# Patient Record
Sex: Male | Born: 1960
Health system: Southern US, Community
[De-identification: ages and names within clinical notes are randomized; demographics above are authoritative.]

## PROBLEM LIST (undated history)

## (undated) HISTORY — PX: HERNIA REPAIR: SHX51

---

## 2010-05-10 DIAGNOSIS — I10 Essential (primary) hypertension: Secondary | ICD-10-CM | POA: Insufficient documentation

## 2012-10-06 ENCOUNTER — Ambulatory Visit: Payer: Self-pay | Admitting: Gastroenterology

## 2012-10-06 LAB — HM COLONOSCOPY

## 2012-10-07 LAB — PATHOLOGY REPORT

## 2013-05-15 ENCOUNTER — Emergency Department: Payer: Self-pay | Admitting: Emergency Medicine

## 2013-09-16 LAB — HEPATIC FUNCTION PANEL
ALK PHOS: 49 U/L (ref 25–125)
ALT: 37 U/L (ref 10–40)
AST: 23 U/L (ref 14–40)
BILIRUBIN, TOTAL: 1 mg/dL

## 2013-09-16 LAB — BASIC METABOLIC PANEL
BUN: 11 mg/dL (ref 4–21)
CREATININE: 0.9 mg/dL (ref 0.6–1.3)
Glucose: 98 mg/dL
POTASSIUM: 4.7 mmol/L (ref 3.4–5.3)
SODIUM: 140 mmol/L (ref 137–147)

## 2013-09-16 LAB — LIPID PANEL
Cholesterol: 268 mg/dL — AB (ref 0–200)
HDL: 46 mg/dL (ref 35–70)
LDL CALC: 179 mg/dL
LDL/HDL RATIO: 3.9
TRIGLYCERIDES: 216 mg/dL — AB (ref 40–160)

## 2013-09-16 LAB — CBC AND DIFFERENTIAL
HCT: 48 % (ref 41–53)
HEMOGLOBIN: 16.7 g/dL (ref 13.5–17.5)
Neutrophils Absolute: 4 /uL
PLATELETS: 257 10*3/uL (ref 150–399)
WBC: 7.4 10^3/mL

## 2013-09-16 LAB — PSA: PSA: 0.7

## 2013-09-16 LAB — TSH: TSH: 3.65 u[IU]/mL (ref 0.41–5.90)

## 2014-04-30 IMAGING — CR RIGHT FOOT COMPLETE - 3+ VIEW
1 series · 4 of 4 positions shown · non-contrast
Comparison: none

REASON FOR EXAM: dorsum pain, swelling s/p trauma
COMMENTS:

PROCEDURE:     DXR - DXR FOOT RT COMPLETE W/OBLIQUES  - May 16, 2013 [DATE]
RESULT:     Right foot images demonstrate no definite fracture, dislocation
or radiopaque foreign body.

[Series 1: x foot ap right · 0.14mm/px · 4 of 4 slices shown]
[im 1/4]
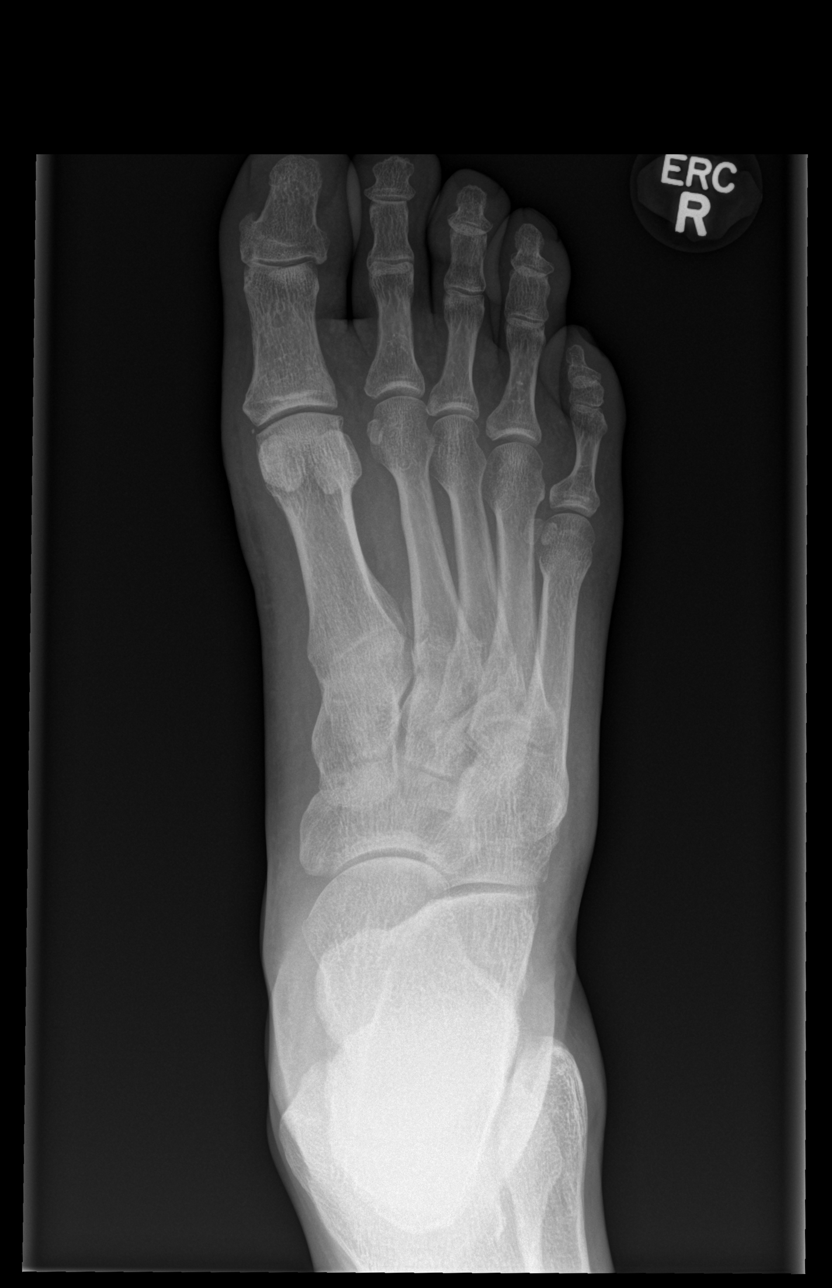
[im 2/4]
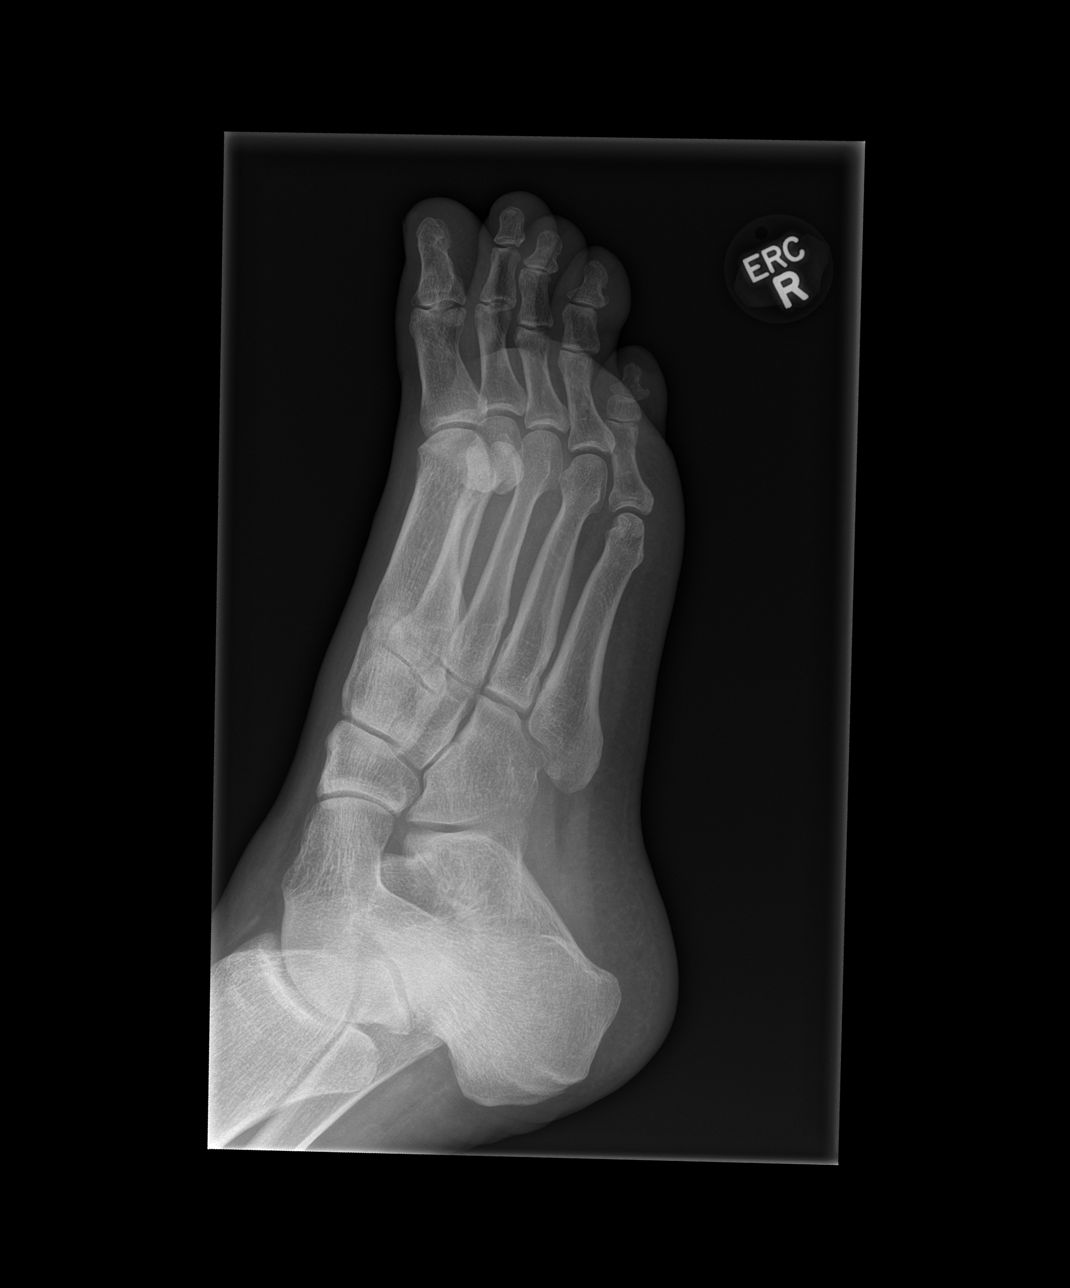
[im 3/4]
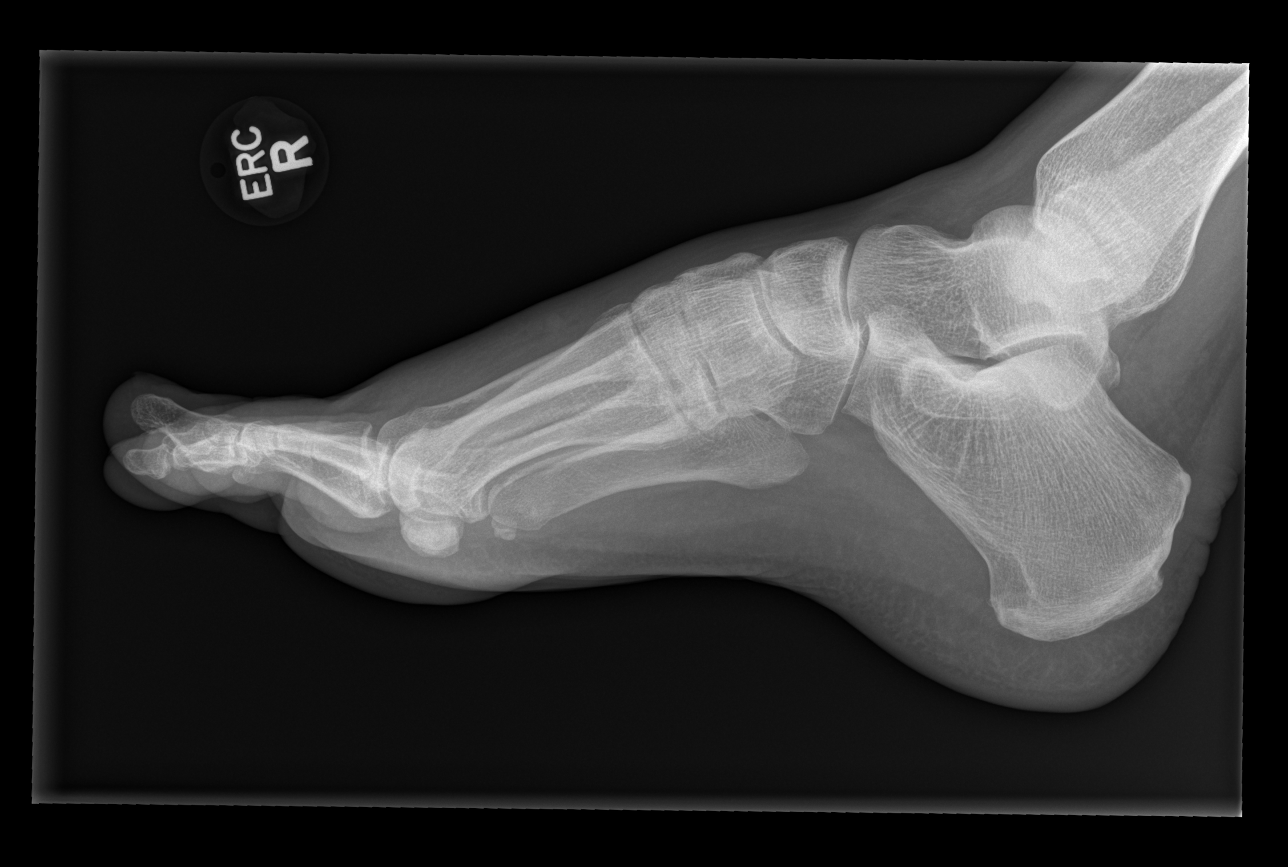
[im 4/4]
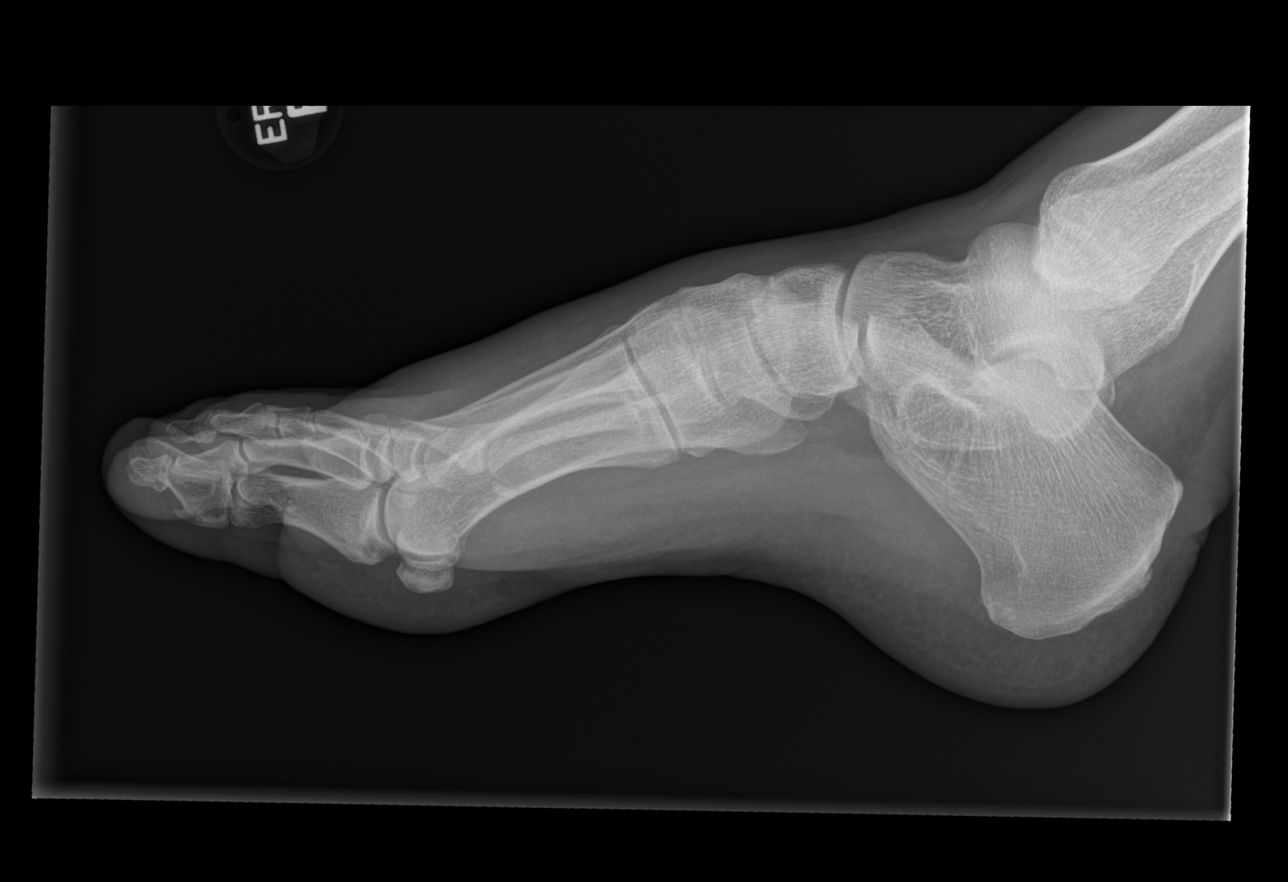

[4 of 4 positions shown; findings below may reference images not displayed]

IMPRESSION: Please see above.

[REDACTED]

## 2016-02-29 DIAGNOSIS — R001 Bradycardia, unspecified: Secondary | ICD-10-CM | POA: Insufficient documentation

## 2016-02-29 DIAGNOSIS — K219 Gastro-esophageal reflux disease without esophagitis: Secondary | ICD-10-CM | POA: Insufficient documentation

## 2016-02-29 DIAGNOSIS — D171 Benign lipomatous neoplasm of skin and subcutaneous tissue of trunk: Secondary | ICD-10-CM | POA: Insufficient documentation

## 2016-02-29 DIAGNOSIS — D239 Other benign neoplasm of skin, unspecified: Secondary | ICD-10-CM | POA: Insufficient documentation

## 2016-03-07 ENCOUNTER — Encounter: Payer: Self-pay | Admitting: Family Medicine

## 2016-04-08 DIAGNOSIS — H9042 Sensorineural hearing loss, unilateral, left ear, with unrestricted hearing on the contralateral side: Secondary | ICD-10-CM | POA: Diagnosis not present

## 2016-04-08 DIAGNOSIS — H6123 Impacted cerumen, bilateral: Secondary | ICD-10-CM | POA: Diagnosis not present

## 2016-04-09 ENCOUNTER — Encounter: Payer: Self-pay | Admitting: Family Medicine

## 2016-04-25 ENCOUNTER — Encounter: Payer: Self-pay | Admitting: Family Medicine

## 2016-04-25 ENCOUNTER — Ambulatory Visit (INDEPENDENT_AMBULATORY_CARE_PROVIDER_SITE_OTHER): Payer: BLUE CROSS/BLUE SHIELD | Admitting: Family Medicine

## 2016-04-25 VITALS — BP 172/94 | HR 58 | Temp 98.1°F | Resp 16 | Ht 73.0 in | Wt 216.0 lb

## 2016-04-25 DIAGNOSIS — G629 Polyneuropathy, unspecified: Secondary | ICD-10-CM | POA: Diagnosis not present

## 2016-04-25 DIAGNOSIS — Z23 Encounter for immunization: Secondary | ICD-10-CM

## 2016-04-25 DIAGNOSIS — R55 Syncope and collapse: Secondary | ICD-10-CM

## 2016-04-25 DIAGNOSIS — Z0000 Encounter for general adult medical examination without abnormal findings: Secondary | ICD-10-CM

## 2016-04-25 DIAGNOSIS — Z125 Encounter for screening for malignant neoplasm of prostate: Secondary | ICD-10-CM

## 2016-04-25 DIAGNOSIS — I10 Essential (primary) hypertension: Secondary | ICD-10-CM | POA: Diagnosis not present

## 2016-04-25 DIAGNOSIS — Z1211 Encounter for screening for malignant neoplasm of colon: Secondary | ICD-10-CM | POA: Diagnosis not present

## 2016-04-25 LAB — POCT URINALYSIS DIPSTICK
Bilirubin, UA: NEGATIVE
Blood, UA: NEGATIVE
GLUCOSE UA: NEGATIVE
KETONES UA: NEGATIVE
Leukocytes, UA: NEGATIVE
Nitrite, UA: NEGATIVE
Protein, UA: NEGATIVE
SPEC GRAV UA: 1.02
Urobilinogen, UA: NEGATIVE
pH, UA: 6

## 2016-04-25 LAB — IFOBT (OCCULT BLOOD): IFOBT: NEGATIVE

## 2016-04-25 MED ORDER — LISINOPRIL-HYDROCHLOROTHIAZIDE 20-12.5 MG PO TABS: 1.0000 | ORAL_TABLET | Freq: Every day | ORAL | Status: DC

## 2016-04-25 MED ORDER — AMLODIPINE BESYLATE 5 MG PO TABS: 5.0000 mg | ORAL_TABLET | Freq: Every day | ORAL | Status: DC

## 2016-04-25 NOTE — Progress Notes (Signed)
Patient ID: [name], male   DOB: 12-24-1960, 55 y.o.   MRN: [id number]       Patient: Ronnie Campos, Male    DOB: February 07, 1961, 55 y.o.   MRN: [id number] Visit Date: 04/25/2016  Today's Provider: [name], MD   Chief Complaint  Patient presents with  . Annual Exam  . Hypertension   Subjective:    Annual physical exam Ronnie Campos is a 55 y.o. male who presents today for health maintenance and complete physical. He feels well. He reports exercising occasionally. He reports he is sleeping fairly well.  Last: Colonoscopy- 10/06/2012. Hyperplastic polyps. Repeat in 10 years.    Patient also reports that his blood pressure has been high. Patient reports that he was on blood pressure medication before in the past, but after running out of medication he did not have it refilled. Patient does admit to having a mild headache. Denies shortness of breath, chest pain, blurred vision, or dizziness.   he says he is probably not taking any medication for about 1 year. Patient has been out of the [place] 2 [place] due to his employer. He has not been to any other primary care provider since he was last seen here in our office.   Review of Systems  Constitutional: Negative.   Eyes: Negative.   Respiratory: Negative.   Cardiovascular: Negative.   Gastrointestinal: Negative.   Endocrine: Negative.   Genitourinary: Negative.   Musculoskeletal: Negative.   Skin: Negative.   Allergic/Immunologic: Negative.   Neurological: Positive for headaches.  Hematological: Negative.   Psychiatric/Behavioral: Negative.     Social History      He  reports that he has never smoked. He does not have any smokeless tobacco history on file. He reports that he drinks alcohol.       Social History   Social History  . Marital Status: Single    Spouse Name: N/A  . Number of Children: N/A  . Years of Education: N/A   Social History Main Topics  . Smoking status: Never Smoker   . Smokeless  tobacco: None  . Alcohol Use: 0.0 oz/week    0 Standard drinks or equivalent per week  . Drug Use: None  . Sexual Activity: Not Asked   Other Topics Concern  . None   Social History Narrative    History reviewed. No pertinent past medical history.   Patient Active Problem List   Diagnosis Date Noted  . Bradycardia 02/29/2016  . Acid reflux 02/29/2016  . Lipoma of back 02/29/2016  . Benign neoplasm of skin 02/29/2016  . BP (high blood pressure) 05/10/2010    Past Surgical History  Procedure Laterality Date  . Hernia repair      Family History        Family Status  Relation Status Death Age  . Mother Alive   . Father Alive   . Sister Alive   . Sister Alive   . Sister Alive   . Sister Alive         His family history includes Irregular heart beat in his mother; Scoliosis in his sister; Stomach cancer in his maternal grandmother.    No Known Allergies  Previous Medications   AMLODIPINE (NORVASC) 5 MG TABLET    Take by mouth. Reported on 04/25/2016   CHOLECALCIFEROL (VITAMIN D) 1000 UNITS TABLET    Take by mouth. Reported on 04/25/2016   LISINOPRIL-HYDROCHLOROTHIAZIDE (PRINZIDE,ZESTORETIC) 20-12.5 MG TABLET    Take by mouth.  Reported on 04/25/2016   MINOXIDIL (ROGAINE) 2 % EXTERNAL SOLUTION    Reported on 04/25/2016   MULTIPLE VITAMINS-MINERALS PO       OMEGA-3 FATTY ACIDS PO       OMEPRAZOLE (PRILOSEC) 20 MG CAPSULE    Take by mouth. Reported on 04/25/2016    Patient Care Team: Jerrol Banana., MD as PCP - General (Family Medicine)     Objective:   Vitals: BP 172/94 mmHg  Pulse 58  Temp(Src) 98.1 F (36.7 C)  Resp 16  Ht 6' 1"  (1.854 m)  Wt 216 lb (97.977 kg)  BMI 28.50 kg/m2   Physical Exam  Constitutional: He is oriented to person, place, and time. He appears well-developed and well-nourished.  HENT:  Head: Normocephalic and atraumatic.  Right Ear: External ear normal.  Left Ear: External ear normal.  Nose: Nose normal.  Mouth/Throat:  Oropharynx is clear and moist.  Eyes: Conjunctivae and EOM are normal. Pupils are equal, round, and reactive to light.  Neck: Neck supple. No thyromegaly present.  Cardiovascular: Normal rate, regular rhythm, normal heart sounds and intact distal pulses.   Pulmonary/Chest: Effort normal and breath sounds normal.  Abdominal: Soft.  Genitourinary: Rectum normal, prostate normal and penis normal.  Musculoskeletal: Normal range of motion.  Lymphadenopathy:    He has no cervical adenopathy.  Neurological: He is alert and oriented to person, place, and time. No cranial nerve deficit. He exhibits normal muscle tone. Coordination normal.  Skin: Skin is warm and dry.  Psychiatric: He has a normal mood and affect. His behavior is normal. Judgment and thought content normal.        Assessment & Plan:     Routine Health Maintenance and Physical Exam  Exercise Activities and Dietary recommendations Goals    None      Immunization History  Administered Date(s) Administered  . Hepatitis A 06/27/2000, 01/09/2001  . Hepatitis B 06/27/2000, 01/09/2001, 10/25/2004  . MMR 10/25/2004  . Td 10/25/2004  . Typhoid Inactivated 10/25/2004  . Typhoid Live 06/27/2000    Health Maintenance  Topic Date Due  . Hepatitis C Screening  16-Jan-1961  . HIV Screening  03/12/1976  . COLONOSCOPY  03/13/2011  . TETANUS/TDAP  10/25/2014  . INFLUENZA VACCINE  07/16/2016      Discussed health benefits of physical activity, and encouraged him to engage in regular exercise appropriate for his age and condition.        Hypertension         Restart all medications , including CCP, ACEI, diuretic.          Return to clinic 1 month. Discussed with patient that we may need to do workup for secondary hypertension if remains elevated. I have done the exam and reviewed the above chart and it is accurate to the best of my knowledge.  --------------------------------------------------------------------

## 2016-04-26 LAB — CBC WITH DIFFERENTIAL/PLATELET
BASOS: 0 %
Basophils Absolute: 0 10*3/uL (ref 0.0–0.2)
EOS (ABSOLUTE): 0.2 10*3/uL (ref 0.0–0.4)
EOS: 3 %
HEMATOCRIT: 47.5 % (ref 37.5–51.0)
Hemoglobin: 16.6 g/dL (ref 12.6–17.7)
IMMATURE GRANULOCYTES: 0 %
Immature Grans (Abs): 0 10*3/uL (ref 0.0–0.1)
Lymphocytes Absolute: 2.6 10*3/uL (ref 0.7–3.1)
Lymphs: 32 %
MCH: 32.5 pg (ref 26.6–33.0)
MCHC: 34.9 g/dL (ref 31.5–35.7)
MCV: 93 fL (ref 79–97)
MONOS ABS: 0.5 10*3/uL (ref 0.1–0.9)
Monocytes: 7 %
NEUTROS ABS: 4.6 10*3/uL (ref 1.4–7.0)
NEUTROS PCT: 58 %
Platelets: 275 10*3/uL (ref 150–379)
RBC: 5.11 x10E6/uL (ref 4.14–5.80)
RDW: 14 % (ref 12.3–15.4)
WBC: 8 10*3/uL (ref 3.4–10.8)

## 2016-04-26 LAB — PSA: PROSTATE SPECIFIC AG, SERUM: 1 ng/mL (ref 0.0–4.0)

## 2016-04-26 LAB — COMPREHENSIVE METABOLIC PANEL
ALT: 19 IU/L (ref 0–44)
AST: 22 IU/L (ref 0–40)
Albumin/Globulin Ratio: 1.5 (ref 1.2–2.2)
Albumin: 4.8 g/dL (ref 3.5–5.5)
Alkaline Phosphatase: 59 IU/L (ref 39–117)
BUN / CREAT RATIO: 9 (ref 9–20)
BUN: 9 mg/dL (ref 6–24)
Bilirubin Total: 1.1 mg/dL (ref 0.0–1.2)
CALCIUM: 10.4 mg/dL — AB (ref 8.7–10.2)
CO2: 24 mmol/L (ref 18–29)
CREATININE: 1.04 mg/dL (ref 0.76–1.27)
Chloride: 98 mmol/L (ref 96–106)
GFR, EST AFRICAN AMERICAN: 93 mL/min/{1.73_m2} (ref >59)
GFR, EST NON AFRICAN AMERICAN: 80 mL/min/{1.73_m2} (ref >59)
GLOBULIN, TOTAL: 3.2 g/dL (ref 1.5–4.5)
Glucose: 92 mg/dL (ref 65–99)
Potassium: 4.7 mmol/L (ref 3.5–5.2)
Sodium: 137 mmol/L (ref 134–144)
TOTAL PROTEIN: 8 g/dL (ref 6.0–8.5)

## 2016-04-26 LAB — LIPID PANEL
CHOL/HDL RATIO: 5.2 ratio — AB (ref 0.0–5.0)
Cholesterol, Total: 248 mg/dL — ABNORMAL HIGH (ref 100–199)
HDL: 48 mg/dL (ref >39)
LDL CALC: 175 mg/dL — AB (ref 0–99)
Triglycerides: 127 mg/dL (ref 0–149)
VLDL CHOLESTEROL CAL: 25 mg/dL (ref 5–40)

## 2016-04-26 LAB — HEMOGLOBIN A1C
Est. average glucose Bld gHb Est-mCnc: 114 mg/dL
HEMOGLOBIN A1C: 5.6 % (ref 4.8–5.6)

## 2016-04-26 LAB — VITAMIN B12: Vitamin B-12: 610 pg/mL (ref 211–946)

## 2016-04-26 LAB — TSH: TSH: 5.24 u[IU]/mL — AB (ref 0.450–4.500)

## 2016-05-23 ENCOUNTER — Ambulatory Visit: Payer: BLUE CROSS/BLUE SHIELD | Admitting: Family Medicine

## 2016-05-23 ENCOUNTER — Ambulatory Visit (INDEPENDENT_AMBULATORY_CARE_PROVIDER_SITE_OTHER): Payer: BLUE CROSS/BLUE SHIELD | Admitting: Family Medicine

## 2016-05-23 ENCOUNTER — Encounter: Payer: Self-pay | Admitting: Family Medicine

## 2016-05-23 VITALS — BP 138/88 | HR 64 | Temp 97.9°F | Resp 16 | Wt 214.0 lb

## 2016-05-23 DIAGNOSIS — I10 Essential (primary) hypertension: Secondary | ICD-10-CM
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
## 2016-06-03 ENCOUNTER — Other Ambulatory Visit: Payer: Self-pay

## 2016-06-03 DIAGNOSIS — I10 Essential (primary) hypertension: Secondary | ICD-10-CM

## 2016-06-03 MED ORDER — AMLODIPINE BESYLATE 5 MG PO TABS: 5.0000 mg | ORAL_TABLET | Freq: Every day | ORAL | Status: DC

## 2016-06-03 MED ORDER — LISINOPRIL-HYDROCHLOROTHIAZIDE 20-12.5 MG PO TABS: 1.0000 | ORAL_TABLET | Freq: Every day | ORAL | Status: DC

## 2017-03-24 ENCOUNTER — Telehealth: Payer: Self-pay | Admitting: Family Medicine

## 2017-03-24 NOTE — Telephone Encounter (Signed)
yes

## 2017-03-24 NOTE — Telephone Encounter (Signed)
Advised  ED 

## 2017-03-24 NOTE — Telephone Encounter (Signed)
He wants to see if you think it is ok to do that. LOV 05/2016-aa

## 2017-03-24 NOTE — Telephone Encounter (Signed)
Pt called saying he needed to get permission to get a non evasive heart scan.  No heart issues right now. He wants to get this done at a clinic in [place].   He's states he travels a lot and is in [place] Right now.  They are offering a special at a clinic there.  His return number is [phone]  [facility]

## 2017-03-25 NOTE — Telephone Encounter (Signed)
This was donee at 12:30 pm before I went to lunch-aa

## 2017-03-25 NOTE — Telephone Encounter (Signed)
Diane ( a radiologist from the [place]) has requested that we fax an order to them for the heart scan at [phone]. Thanks!

## 2017-04-09 ENCOUNTER — Encounter: Payer: Self-pay | Admitting: Family Medicine

## 2017-06-16 ENCOUNTER — Encounter: Payer: Self-pay | Admitting: Family Medicine

## 2017-06-16 ENCOUNTER — Ambulatory Visit (INDEPENDENT_AMBULATORY_CARE_PROVIDER_SITE_OTHER): Payer: BLUE CROSS/BLUE SHIELD | Admitting: Family Medicine

## 2017-06-16 VITALS — BP 128/84 | HR 62 | Temp 97.8°F | Resp 12 | Ht 73.25 in | Wt 214.0 lb

## 2017-06-16 DIAGNOSIS — Z0000 Encounter for general adult medical examination without abnormal findings: Secondary | ICD-10-CM

## 2017-06-16 DIAGNOSIS — E559 Vitamin D deficiency, unspecified: Secondary | ICD-10-CM | POA: Diagnosis not present

## 2017-06-16 DIAGNOSIS — I10 Essential (primary) hypertension: Secondary | ICD-10-CM | POA: Diagnosis not present

## 2017-06-16 MED ORDER — LISINOPRIL-HYDROCHLOROTHIAZIDE 20-12.5 MG PO TABS: 1.0000 | ORAL_TABLET | Freq: Every day | ORAL | 3 refills | Status: DC

## 2017-06-16 MED ORDER — AMLODIPINE BESYLATE 5 MG PO TABS: 5.0000 mg | ORAL_TABLET | Freq: Every day | ORAL | 3 refills | Status: DC

## 2017-06-16 NOTE — Patient Instructions (Signed)

## 2017-06-16 NOTE — Progress Notes (Signed)
Patient: Ronnie Campos, Male    DOB: Jan 01, 1961, 56 y.o.   MRN: [id number] Visit Date: 06/16/2017  Today's Provider: Vernie Murders, PA   Chief Complaint  Patient presents with  . Annual Exam   Subjective:    Annual physical exam Ronnie Campos is a 56 y.o. male who presents today for health maintenance and complete physical. He feels well. He reports exercising running and has fitness center, trying to increase the amount of exercise he is doing. He reports he is sleeping fairly well.  Immunization History  Administered Date(s) Administered  . Hepatitis A 06/27/2000, 01/09/2001  . Hepatitis B 06/27/2000, 01/09/2001, 10/25/2004  . MMR 10/25/2004  . Td 10/25/2004  . Tdap 04/25/2016  . Typhoid Inactivated 10/25/2004  . Typhoid Live 06/27/2000  . Yellow Fever 01/09/2001   Last colonoscopy was 10/06/12-hyperplastic polyps, repeat in 2023. Last lab work was done on 04/25/16-per that note needs calcium, ionized calcium and PTH repeated and this was not done.   Review of Systems  Constitutional: Negative.   HENT: Negative.   Eyes: Negative.   Respiratory: Positive for shortness of breath.   Cardiovascular: Negative.   Gastrointestinal: Negative.   Endocrine: Negative.   Genitourinary: Negative.   Musculoskeletal: Positive for arthralgias.  Skin: Negative.   Allergic/Immunologic: Negative.   Neurological: Positive for numbness and headaches.  Hematological: Negative.   Psychiatric/Behavioral: Positive for sleep disturbance. The patient is nervous/anxious.     Social History      He  reports that he has never smoked. He has never used smokeless tobacco. He reports that he drinks alcohol. He reports that he does not use drugs.       Social History   Social History  . Marital status: Single    Spouse name: N/A  . Number of children: N/A  . Years of education: N/A   Social History Main Topics  . Smoking status: Never Smoker  . Smokeless tobacco: Never Used  .  Alcohol use 0.0 oz/week  . Drug use: No  . Sexual activity: Yes    Birth control/ protection: None   Other Topics Concern  . None   Social History Narrative  . None    History reviewed. No pertinent past medical history.   Patient Active Problem List   Diagnosis Date Noted  . Bradycardia 02/29/2016  . Acid reflux 02/29/2016  . Lipoma of back 02/29/2016  . Benign neoplasm of skin 02/29/2016  . BP (high blood pressure) 05/10/2010    Past Surgical History:  Procedure Laterality Date  . HERNIA REPAIR      Family History        Family Status  Relation Status  . Mother Alive  . Father Alive  . Sister Alive  . Sister Alive  . Sister Alive  . Sister Alive  . MGM (Not Specified)        His family history includes Irregular heart beat in his mother; Scoliosis in his sister; Stomach cancer in his maternal grandmother.     No Known Allergies   Current Outpatient Prescriptions:  .  amLODipine (NORVASC) 5 MG tablet, Take 1 tablet (5 mg total) by mouth daily. Reported on 04/25/2016, Disp: 90 tablet, Rfl: 3 .  cholecalciferol (VITAMIN D) 1000 units tablet, Take by mouth. Reported on 04/25/2016, Disp: , Rfl:  .  lisinopril-hydrochlorothiazide (PRINZIDE,ZESTORETIC) 20-12.5 MG tablet, Take 1 tablet by mouth daily. Reported on 04/25/2016, Disp: 90 tablet, Rfl: 3 .  MULTIPLE VITAMINS-MINERALS PO, , Disp: ,  Rfl:  .  OMEGA-3 FATTY ACIDS PO, , Disp: , Rfl:    Patient Care Team: Jerrol Banana., MD as PCP - General (Family Medicine)      Objective:   Vitals: BP 128/84   Pulse 62   Temp 97.8 F (36.6 C)   Resp 12   Ht 6' 1.25" (1.861 m)   Wt 214 lb (97.1 kg)   BMI 28.04 kg/m    Wt Readings from Last 3 Encounters:  06/16/17 214 lb (97.1 kg)  05/23/16 214 lb (97.1 kg)  04/25/16 216 lb (98 kg)    BP Readings from Last 3 Encounters:  06/16/17 128/84  05/23/16 138/88  04/25/16 (!) 172/94    Vitals:   06/16/17 0923  BP: 128/84  Pulse: 62  Resp: 12  Temp: 97.8  F (36.6 C)  Weight: 214 lb (97.1 kg)  Height: 6' 1.25" (1.861 m)     Physical Exam  Constitutional: He is oriented to person, place, and time. He appears well-developed and well-nourished.  HENT:  Head: Normocephalic and atraumatic.  Right Ear: External ear normal.  Left Ear: External ear normal.  Nose: Nose normal.  Mouth/Throat: Oropharynx is clear and moist.  Eyes: Conjunctivae and EOM are normal. Pupils are equal, round, and reactive to light. Right eye exhibits no discharge.  Neck: Normal range of motion. Neck supple. No tracheal deviation present. No thyromegaly present.  Cardiovascular: Normal rate, regular rhythm, normal heart sounds and intact distal pulses.   No murmur heard. Pulmonary/Chest: Effort normal and breath sounds normal. No respiratory distress. He has no wheezes. He has no rales. He exhibits no tenderness.  Abdominal: Soft. He exhibits no distension and no mass. There is no tenderness. There is no rebound and no guarding.  Genitourinary: Rectum normal, prostate normal and penis normal. Rectal exam shows guaiac negative stool.  Genitourinary Comments: Right inguinal hernia with well healed scar from herniorrhaphy in 1989 by Dr. Jamal Collin.  Musculoskeletal: Normal range of motion. He exhibits no edema or tenderness.  Lymphadenopathy:    He has no cervical adenopathy.  Neurological: He is alert and oriented to person, place, and time. He has normal reflexes. No cranial nerve deficit. He exhibits normal muscle tone. Coordination normal.  Skin: Skin is warm and dry. No rash noted. No erythema.  Psychiatric: He has a normal mood and affect. His behavior is normal. Judgment and thought content normal.     Depression Screen PHQ 2/9 Scores 06/16/2017  PHQ - 2 Score 0  PHQ- 9 Score 4    Assessment & Plan:    1. Annual physical exam Good general health. Right inguinal hernia stable without discomfort. Prefers to monitor. Immunizations up to date. Given anticipatory  guidance. Recheck routine labs. - CBC with Differential/Platelet - Lipid panel - TSH  2. Essential hypertension Stable and well controlled. Tolerating Prinzide and Norvasc without side effects. Recheck labs and follow pending report.s - lisinopril-hydrochlorothiazide (PRINZIDE,ZESTORETIC) 20-12.5 MG tablet; Take 1 tablet by mouth daily. Reported on 04/25/2016  Dispense: 90 tablet; Refill: 3 - amLODipine (NORVASC) 5 MG tablet; Take 1 tablet (5 mg total) by mouth daily. Reported on 04/25/2016  Dispense: 90 tablet; Refill: 3 - CBC with Differential/Platelet - TSH - Renal Function Panel  3. Serum calcium elevated Elevated calcium level last year. Recheck levels, renal function and PTH. - Calcium, ionized - PTH, Intact and Calcium - Renal Function Panel  4. Vitamin D deficiency Still taking Vitamin-D supplement daily. Will check follow up labs. -  CBC with Differential/Platelet - VITAMIN D 25 Hydroxy (Vit-D Deficiency, Fractures)   Vernie Murders, PA  [facility]

## 2017-06-17 ENCOUNTER — Telehealth: Payer: Self-pay

## 2017-06-17 LAB — CBC WITH DIFFERENTIAL/PLATELET
BASOS: 1 %
Basophils Absolute: 0 10*3/uL (ref 0.0–0.2)
EOS (ABSOLUTE): 0.2 10*3/uL (ref 0.0–0.4)
EOS: 3 %
HEMOGLOBIN: 16.3 g/dL (ref 13.0–17.7)
Hematocrit: 47.5 % (ref 37.5–51.0)
IMMATURE GRANS (ABS): 0 10*3/uL (ref 0.0–0.1)
IMMATURE GRANULOCYTES: 0 %
LYMPHS: 42 %
Lymphocytes Absolute: 2.8 10*3/uL (ref 0.7–3.1)
MCH: 31.8 pg (ref 26.6–33.0)
MCHC: 34.3 g/dL (ref 31.5–35.7)
MCV: 93 fL (ref 79–97)
MONOCYTES: 9 %
Monocytes Absolute: 0.6 10*3/uL (ref 0.1–0.9)
NEUTROS ABS: 3.1 10*3/uL (ref 1.4–7.0)
NEUTROS PCT: 45 %
PLATELETS: 240 10*3/uL (ref 150–379)
RBC: 5.12 x10E6/uL (ref 4.14–5.80)
RDW: 14.7 % (ref 12.3–15.4)
WBC: 6.8 10*3/uL (ref 3.4–10.8)

## 2017-06-17 LAB — RENAL FUNCTION PANEL
ALBUMIN: 4.9 g/dL (ref 3.5–5.5)
BUN/Creatinine Ratio: 16 (ref 9–20)
BUN: 16 mg/dL (ref 6–24)
CALCIUM: 10.2 mg/dL (ref 8.7–10.2)
CO2: 23 mmol/L (ref 20–29)
CREATININE: 1.01 mg/dL (ref 0.76–1.27)
Chloride: 98 mmol/L (ref 96–106)
GFR calc Af Amer: 96 mL/min/{1.73_m2} (ref >59)
GFR calc non Af Amer: 83 mL/min/{1.73_m2} (ref >59)
GLUCOSE: 93 mg/dL (ref 65–99)
PHOSPHORUS: 3.5 mg/dL (ref 2.5–4.5)
POTASSIUM: 4.8 mmol/L (ref 3.5–5.2)
SODIUM: 137 mmol/L (ref 134–144)

## 2017-06-17 LAB — LIPID PANEL
Chol/HDL Ratio: 4.3 ratio (ref 0.0–5.0)
Cholesterol, Total: 220 mg/dL — ABNORMAL HIGH (ref 100–199)
HDL: 51 mg/dL (ref >39)
LDL CALC: 142 mg/dL — AB (ref 0–99)
Triglycerides: 134 mg/dL (ref 0–149)
VLDL CHOLESTEROL CAL: 27 mg/dL (ref 5–40)

## 2017-06-17 LAB — VITAMIN D 25 HYDROXY (VIT D DEFICIENCY, FRACTURES): Vit D, 25-Hydroxy: 22.1 ng/mL — ABNORMAL LOW (ref 30.0–100.0)

## 2017-06-17 LAB — TSH: TSH: 4.07 u[IU]/mL (ref 0.450–4.500)

## 2017-06-17 LAB — PTH, INTACT AND CALCIUM: PTH: 38 pg/mL (ref 15–65)

## 2017-06-17 LAB — CALCIUM, IONIZED: Calcium, Ion: 5.3 mg/dL (ref 4.5–5.6)

## 2017-06-17 NOTE — Telephone Encounter (Signed)
Patient advised. He states he will call back next week to schedule a follow up appointment.

## 2017-06-17 NOTE — Telephone Encounter (Signed)
-----   Message from Margo Common, [place] sent at 06/17/2017  8:20 AM EDT ----- Calcium back to normal and parathyroid function normal. All tests normal except LDL cholesterol still high (but better than a year ago). Continue low fat diet, Krill Oil 1000 mg BID and add Red Yeast Rice qd. Schedule follow up appointment in 3-4 months to assess progress.

## 2018-06-10 DIAGNOSIS — H903 Sensorineural hearing loss, bilateral: Secondary | ICD-10-CM | POA: Diagnosis not present

## 2018-09-17 ENCOUNTER — Ambulatory Visit (INDEPENDENT_AMBULATORY_CARE_PROVIDER_SITE_OTHER): Payer: BLUE CROSS/BLUE SHIELD | Admitting: Family Medicine

## 2018-09-17 ENCOUNTER — Ambulatory Visit
Admission: RE | Admit: 2018-09-17 | Discharge: 2018-09-17 | Disposition: A | Discharge status: Home / Self Care | Payer: Self-pay | Source: Ambulatory Visit | Attending: Family Medicine | Admitting: Family Medicine

## 2018-09-17 VITALS — BP 132/90 | HR 60 | Temp 98.9°F | Resp 16 | Wt 214.0 lb

## 2018-09-17 DIAGNOSIS — M79641 Pain in right hand: Secondary | ICD-10-CM

## 2018-09-17 DIAGNOSIS — M79642 Pain in left hand: Secondary | ICD-10-CM

## 2018-09-17 MED ORDER — NAPROXEN 500 MG PO TABS: 500.0000 mg | ORAL_TABLET | Freq: Two times a day (BID) | ORAL | 1 refills | Status: DC | PRN

## 2018-09-17 NOTE — Progress Notes (Signed)
Ronnie Campos  MRN: [id number] DOB: 1961/01/15  Subjective:  HPI   The patient is a 57 year old male who presents for evaluation of left thumb pain and stiffness.  He states that he fell about 3 weeks ago and fell landing on his hand.  He states it swelled and he used ice on it for about 1 week.  The swelling went down and the hand is doing better.  However, his thumb is still hurting and he has limited movement of it.  There is no radiation of pain into the arm.    Patient Active Problem List   Diagnosis Date Noted  . Bradycardia 02/29/2016  . Acid reflux 02/29/2016  . Lipoma of back 02/29/2016  . Benign neoplasm of skin 02/29/2016  . BP (high blood pressure) 05/10/2010    No past medical history on file.  Social History   Socioeconomic History  . Marital status: Single    Spouse name: Not on file  . Number of children: Not on file  . Years of education: Not on file  . Highest education level: Not on file  Occupational History  . Not on file  Social Needs  . Financial resource strain: Not on file  . Food insecurity:    Worry: Not on file    Inability: Not on file  . Transportation needs:    Medical: Not on file    Non-medical: Not on file  Tobacco Use  . Smoking status: Never Smoker  . Smokeless tobacco: Never Used  Substance and Sexual Activity  . Alcohol use: Yes    Alcohol/week: 0.0 standard drinks  . Drug use: No  . Sexual activity: Yes    Birth control/protection: None  Lifestyle  . Physical activity:    Days per week: Not on file    Minutes per session: Not on file  . Stress: Not on file  Relationships  . Social connections:    Talks on phone: Not on file    Gets together: Not on file    Attends religious service: Not on file    Active member of club or organization: Not on file    Attends meetings of clubs or organizations: Not on file    Relationship status: Not on file  . Intimate partner violence:    Fear of current or ex partner: Not on file     Emotionally abused: Not on file    Physically abused: Not on file    Forced sexual activity: Not on file  Other Topics Concern  . Not on file  Social History Narrative  . Not on file    Outpatient Encounter Medications as of 09/17/2018  Medication Sig Note  . amLODipine (NORVASC) 5 MG tablet Take 1 tablet (5 mg total) by mouth daily. Reported on 04/25/2016   . cholecalciferol (VITAMIN D) 1000 units tablet Take by mouth. Reported on 04/25/2016 02/29/2016: Received from: [facility]  . lisinopril-hydrochlorothiazide (PRINZIDE,ZESTORETIC) 20-12.5 MG tablet Take 1 tablet by mouth daily. Reported on 04/25/2016   . MULTIPLE VITAMINS-MINERALS PO  02/29/2016: Received from: [facility]  . OMEGA-3 FATTY ACIDS PO  02/29/2016: Received from: [facility]   No facility-administered encounter medications on file as of 09/17/2018.     No Known Allergies  Review of Systems  Constitutional: Negative.   Musculoskeletal: Positive for falls and joint pain (thumb).  Neurological: Negative.   Psychiatric/Behavioral: Negative.     Objective:  BP 132/90 (BP Location: Left Arm,  Patient Position: Sitting, Cuff Size: Normal)   Pulse 60   Temp 98.9 F (37.2 C) (Oral)   Resp 16   Wt 214 lb (97.1 kg)   BMI 28.04 kg/m   Physical Exam  Constitutional: He is oriented to person, place, and time and well-developed, well-nourished, and in no distress.  Cardiovascular: Normal rate and regular rhythm.  Pulmonary/Chest: Effort normal.  Musculoskeletal: He exhibits tenderness.  He cannot extend his left thumb at all. Vascular and neurologic exam normal.  Neurological: He is alert and oriented to person, place, and time. Gait normal. GCS score is 15.  Skin: Skin is warm and dry.  Psychiatric: Mood, memory, affect and judgment normal.    Assessment and Plan :  1. Right hand pain  - Ambulatory referral to Hand Surgery - DG Hand Complete Left; Future -  naproxen (NAPROSYN) 500 MG tablet; Take 1 tablet (500 mg total) by mouth 2 (two) times daily as needed.  Dispense: 60 tablet; Refill: 1  2. Hand pain, left Pt has injured left thumb and I think has ruptured extensor tendon. Refer to Copy.  I have done the exam and reviewed the chart and it is accurate to the best of my knowledge. Development worker, community has been used and  any errors in dictation or transcription are unintentional. Miguel Aschoff M.D. [facility]

## 2018-09-21 ENCOUNTER — Telehealth: Payer: Self-pay

## 2018-09-21 NOTE — Telephone Encounter (Signed)
-----   Message from Jerrol Banana., MD sent at 09/18/2018  9:22 AM EDT ----- No fracture--keep ortho appt,

## 2018-09-21 NOTE — Telephone Encounter (Signed)
Left message to call back  

## 2018-09-28 NOTE — Telephone Encounter (Signed)
[facility]MT[facility] ED 

## 2018-11-24 DIAGNOSIS — M79645 Pain in left finger(s): Secondary | ICD-10-CM | POA: Insufficient documentation

## 2018-11-25 DIAGNOSIS — M79645 Pain in left finger(s): Secondary | ICD-10-CM | POA: Diagnosis not present

## 2018-11-25 DIAGNOSIS — S66212A Strain of extensor muscle, fascia and tendon of left thumb at wrist and hand level, initial encounter: Secondary | ICD-10-CM | POA: Diagnosis not present

## 2019-01-18 ENCOUNTER — Encounter: Payer: BLUE CROSS/BLUE SHIELD | Admitting: Family Medicine

## 2019-08-12 ENCOUNTER — Other Ambulatory Visit: Payer: Self-pay

## 2019-08-12 DIAGNOSIS — I10 Essential (primary) hypertension: Secondary | ICD-10-CM

## 2019-08-12 MED ORDER — AMLODIPINE BESYLATE 5 MG PO TABS: 5.0000 mg | ORAL_TABLET | Freq: Every day | ORAL | 0 refills | Status: DC

## 2019-08-12 MED ORDER — LISINOPRIL-HYDROCHLOROTHIAZIDE 20-12.5 MG PO TABS: 1.0000 | ORAL_TABLET | Freq: Every day | ORAL | 0 refills | Status: DC

## 2019-08-12 NOTE — Telephone Encounter (Signed)
Patient request refill

## 2019-11-10 ENCOUNTER — Encounter: Payer: Self-pay | Admitting: Family Medicine

## 2019-12-01 NOTE — Progress Notes (Signed)
Patient: Ronnie Campos, Male    DOB: 1961-08-02, 58 y.o.   MRN: [id number] Visit Date: 12/02/2019  Today's Provider: [name], MD   Chief Complaint  Patient presents with  . Annual Exam   Subjective:     Annual physical exam Ronnie Campos is a 58 y.o. male who presents today for health maintenance and complete physical. He feels well. He reports exercising yes. He reports he is sleeping fairly well. Pt single,no children,travels a lot with work. He feels well. -----------------------------------------------------------  Colonoscopy: 10/06/2012  Review of Systems  Constitutional: Negative.   HENT: Negative.   Eyes: Negative.   Cardiovascular: Negative.   Gastrointestinal: Negative.   Endocrine: Negative.   Musculoskeletal: Positive for neck stiffness.  Allergic/Immunologic: Negative.   Hematological: Negative.   Psychiatric/Behavioral: Negative.     Social History      He  reports that he has never smoked. He has never used smokeless tobacco. He reports current alcohol use. He reports that he does not use drugs.       Social History   Socioeconomic History  . Marital status: Single    Spouse name: Not on file  . Number of children: Not on file  . Years of education: Not on file  . Highest education level: Not on file  Occupational History  . Not on file  Tobacco Use  . Smoking status: Never Smoker  . Smokeless tobacco: Never Used  Substance and Sexual Activity  . Alcohol use: Yes    Alcohol/week: 0.0 standard drinks  . Drug use: No  . Sexual activity: Yes    Birth control/protection: None  Other Topics Concern  . Not on file  Social History Narrative  . Not on file   Social Determinants of Health   Financial Resource Strain:   . Difficulty of Paying Living Expenses: Not on file  Food Insecurity:   . Worried About Charity fundraiser in the Last Year: Not on file  . Ran Out of Food in the Last Year: Not on file  Transportation  Needs:   . Lack of Transportation (Medical): Not on file  . Lack of Transportation (Non-Medical): Not on file  Physical Activity:   . Days of Exercise per Week: Not on file  . Minutes of Exercise per Session: Not on file  Stress:   . Feeling of Stress : Not on file  Social Connections:   . Frequency of Communication with Friends and Family: Not on file  . Frequency of Social Gatherings with Friends and Family: Not on file  . Attends Religious Services: Not on file  . Active Member of Clubs or Organizations: Not on file  . Attends Archivist Meetings: Not on file  . Marital Status: Not on file    History reviewed. No pertinent past medical history.   Patient Active Problem List   Diagnosis Date Noted  . Bradycardia 02/29/2016  . Acid reflux 02/29/2016  . Lipoma of back 02/29/2016  . Benign neoplasm of skin 02/29/2016  . BP (high blood pressure) 05/10/2010    Past Surgical History:  Procedure Laterality Date  . HERNIA REPAIR      Family History        Family Status  Relation Name Status  . Mother  Alive  . Father  Alive  . Sister  Alive  . Sister  Alive  . Sister  Alive  . Sister  Alive  . MGM  (  Not Specified)        His family history includes Irregular heart beat in his mother; Scoliosis in his sister; Stomach cancer in his maternal grandmother.      No Known Allergies   Current Outpatient Medications:  .  amLODipine (NORVASC) 5 MG tablet, Take 1 tablet (5 mg total) by mouth daily. Reported on 04/25/2016, Disp: 90 tablet, Rfl: 0 .  cholecalciferol (VITAMIN D) 1000 units tablet, Take by mouth. Reported on 04/25/2016, Disp: , Rfl:  .  lisinopril-hydrochlorothiazide (ZESTORETIC) 20-12.5 MG tablet, Take 1 tablet by mouth daily. Reported on 04/25/2016, Disp: 90 tablet, Rfl: 0 .  MULTIPLE VITAMINS-MINERALS PO, , Disp: , Rfl:  .  naproxen (NAPROSYN) 500 MG tablet, Take 1 tablet (500 mg total) by mouth 2 (two) times daily as needed. (Patient not taking:  Reported on 12/02/2019), Disp: 60 tablet, Rfl: 1 .  OMEGA-3 FATTY ACIDS PO, , Disp: , Rfl:    Patient Care Team: Jerrol Banana., MD as PCP - General (Family Medicine)    Objective:    Vitals: BP 130/80 (BP Location: Right Arm, Patient Position: Sitting, Cuff Size: Large)   Pulse 80   Temp (!) 97.1 F (36.2 C) (Other (Comment))   Resp 16   Ht '6\' 1"'$  (1.854 m)   Wt 218 lb (98.9 kg)   SpO2 98%   BMI 28.76 kg/m    Vitals:   12/02/19 1529  BP: 130/80  Pulse: 80  Resp: 16  Temp: (!) 97.1 F (36.2 C)  TempSrc: Other (Comment)  SpO2: 98%  Weight: 218 lb (98.9 kg)  Height: '6\' 1"'$  (1.854 m)     Physical Exam Vitals reviewed.  Constitutional:      Appearance: Normal appearance.  HENT:     Head: Normocephalic and atraumatic.     Right Ear: Tympanic membrane and external ear normal.     Left Ear: Tympanic membrane and external ear normal.     Nose: Nose normal.  Eyes:     General: No scleral icterus.    Conjunctiva/sclera: Conjunctivae normal.  Neck:     Vascular: No carotid bruit.  Cardiovascular:     Rate and Rhythm: Normal rate and regular rhythm.     Pulses: Normal pulses.     Heart sounds: Normal heart sounds.  Pulmonary:     Effort: Pulmonary effort is normal.     Breath sounds: Normal breath sounds.  Abdominal:     Palpations: Abdomen is soft.  Musculoskeletal:     Right lower leg: No edema.     Left lower leg: No edema.  Lymphadenopathy:     Cervical: No cervical adenopathy.  Skin:    General: Skin is warm and dry.  Neurological:     General: No focal deficit present.     Mental Status: He is alert and oriented to person, place, and time.  Psychiatric:        Mood and Affect: Mood normal.        Behavior: Behavior normal.        Thought Content: Thought content normal.      Depression Screen PHQ 2/9 Scores 12/02/2019 09/17/2018 06/16/2017  PHQ - 2 Score 0 0 0  PHQ- 9 Score 2 - 4       Assessment & Plan:     Routine Health Maintenance  and Physical Exam  Exercise Activities and Dietary recommendations Goals   None     Immunization History  Administered Date(s) Administered  . Hepatitis  A 06/27/2000, 01/09/2001  . Hepatitis B 06/27/2000, 01/09/2001, 10/25/2004  . MMR 10/25/2004  . Td 10/25/2004  . Tdap 04/25/2016  . Typhoid Inactivated 10/25/2004  . Typhoid Live 06/27/2000  . Yellow Fever 01/09/2001    Health Maintenance  Topic Date Due  . HIV Screening  03/12/1976  . INFLUENZA VACCINE  07/17/2019  . COLONOSCOPY  10/06/2022  . TETANUS/TDAP  04/25/2026  . Hepatitis C Screening  Completed     Discussed health benefits of physical activity, and encouraged him to engage in regular exercise appropriate for his age and condition.    --------------------------------------------------------------------  1. Annual physical exam  - PSA - TSH - CBC w/Diff/Platelet - Comprehensive Metabolic Panel (CMET) - Lipid panel - POCT urinalysis dipstick  2. Hypertension, unspecified type Controlled.  Work on habits.  Return to clinic 1 year. - PSA - TSH - CBC w/Diff/Platelet - Comprehensive Metabolic Panel (CMET) - Lipid panel - POCT urinalysis dipstick  Follow up in one year for CPE.    I,Naftali Carchi,acting as a scribe for [name], MD.,have documented all relevant documentation on the behalf of [name], MD,as directed by  [name], MD while in the presence of [name], MD.    [name], MD  [facility]

## 2019-12-02 ENCOUNTER — Encounter: Payer: Self-pay | Admitting: Family Medicine

## 2019-12-02 ENCOUNTER — Other Ambulatory Visit: Payer: Self-pay

## 2019-12-02 ENCOUNTER — Ambulatory Visit (INDEPENDENT_AMBULATORY_CARE_PROVIDER_SITE_OTHER): Payer: Managed Care, Other (non HMO) | Admitting: Family Medicine

## 2019-12-02 VITALS — BP 130/80 | HR 80 | Temp 97.1°F | Resp 16 | Ht 73.0 in | Wt 218.0 lb

## 2019-12-02 DIAGNOSIS — I10 Essential (primary) hypertension: Secondary | ICD-10-CM | POA: Diagnosis not present

## 2019-12-02 DIAGNOSIS — Z0000 Encounter for general adult medical examination without abnormal findings: Secondary | ICD-10-CM | POA: Diagnosis not present

## 2019-12-02 LAB — POCT URINALYSIS DIPSTICK
Appearance: NORMAL
Bilirubin, UA: NEGATIVE
Glucose, UA: NEGATIVE
Ketones, UA: NEGATIVE
Leukocytes, UA: NEGATIVE
Nitrite, UA: NEGATIVE
Odor: NORMAL
Protein, UA: NEGATIVE
Spec Grav, UA: 1.02 (ref 1.010–1.025)
Urobilinogen, UA: 0.2 E.U./dL
pH, UA: 6 (ref 5.0–8.0)

## 2019-12-07 ENCOUNTER — Other Ambulatory Visit: Payer: Self-pay | Admitting: Family Medicine

## 2019-12-07 DIAGNOSIS — I10 Essential (primary) hypertension: Secondary | ICD-10-CM

## 2020-03-30 ENCOUNTER — Other Ambulatory Visit: Payer: Self-pay | Admitting: Family Medicine

## 2020-03-30 DIAGNOSIS — I10 Essential (primary) hypertension: Secondary | ICD-10-CM

## 2020-08-28 ENCOUNTER — Other Ambulatory Visit: Payer: Self-pay | Admitting: Family Medicine

## 2020-08-28 DIAGNOSIS — I10 Essential (primary) hypertension: Secondary | ICD-10-CM

## 2020-08-28 NOTE — Telephone Encounter (Signed)
[facility] faxed refill request for the following medications:  amLODipine (NORVASC) 5 MG tablet  lisinopril-hydrochlorothiazide (ZESTORETIC) 20-12.5 MG tablet  90 day supply Last Rx: 03/30/2020 LOV: 12/02/2019 NOV: 12/05/2020 Please advise. Thanks TNP

## 2020-08-29 MED ORDER — AMLODIPINE BESYLATE 5 MG PO TABS: ORAL_TABLET | ORAL | 0 refills | Status: DC

## 2020-08-29 MED ORDER — LISINOPRIL-HYDROCHLOROTHIAZIDE 20-12.5 MG PO TABS: 1.0000 | ORAL_TABLET | Freq: Every day | ORAL | 0 refills | Status: DC

## 2020-08-29 NOTE — Telephone Encounter (Signed)
Refill sent to patients pharmacy. 

## 2020-11-20 ENCOUNTER — Other Ambulatory Visit: Payer: Self-pay | Admitting: Family Medicine

## 2020-11-20 DIAGNOSIS — I10 Essential (primary) hypertension: Secondary | ICD-10-CM

## 2020-12-05 ENCOUNTER — Encounter: Payer: Self-pay | Admitting: Family Medicine

## 2020-12-17 ENCOUNTER — Other Ambulatory Visit: Payer: Self-pay | Admitting: Family Medicine

## 2020-12-17 DIAGNOSIS — I10 Essential (primary) hypertension: Secondary | ICD-10-CM

## 2021-01-15 ENCOUNTER — Other Ambulatory Visit: Payer: Self-pay | Admitting: Family Medicine

## 2021-01-15 DIAGNOSIS — I10 Essential (primary) hypertension: Secondary | ICD-10-CM

## 2021-02-08 ENCOUNTER — Other Ambulatory Visit: Payer: Self-pay | Admitting: Family Medicine

## 2021-02-08 DIAGNOSIS — I10 Essential (primary) hypertension: Secondary | ICD-10-CM

## 2021-03-23 NOTE — Patient Instructions (Signed)

## 2021-03-23 NOTE — Progress Notes (Signed)
I,[name],acting as a scribe for [name], MD.,have documented all relevant documentation on the behalf of [name], MD,as directed by  [name], MD while in the presence of [name], MD.    Complete physical exam   Patient: Ronnie Campos   DOB: Mar 13, 1961   60 y.o. Male  MRN: [id number] Visit Date: 03/26/2021  Today's healthcare provider: [name], MD   Chief Complaint  Patient presents with  . Annual Exam   Subjective    Ronnie Campos is a 60 y.o. male who presents today for a complete physical exam.  He reports consuming a general diet. The patient does not participate in regular exercise at present. He generally feels well. He reports sleeping well. He does not have additional problems to discuss today.   HPI  He has a couple of other issues today.  He has a mole on his left shoulder his wife wishes to have checked.  He has some mild tenderness in the area of the left lower thoracic back but this is where his backpack hits each day that he carries to work. His blood pressure is also markedly elevated today although he feels well. 04/25/2016 PSA 1.0 04/25/2016 IFOBT Negative   History reviewed. No pertinent past medical history. Past Surgical History:  Procedure Laterality Date  . HERNIA REPAIR     Social History   Socioeconomic History  . Marital status: Single    Spouse name: Not on file  . Number of children: Not on file  . Years of education: Not on file  . Highest education level: Not on file  Occupational History  . Not on file  Tobacco Use  . Smoking status: Never Smoker  . Smokeless tobacco: Never Used  Vaping Use  . Vaping Use: Never used  Substance and Sexual Activity  . Alcohol use: Yes    Alcohol/week: 0.0 standard drinks  . Drug use: No  . Sexual activity: Yes    Birth control/protection: None  Other Topics Concern  . Not on file  Social History Narrative  . Not on file   Social  Determinants of Health   Financial Resource Strain: Not on file  Food Insecurity: Not on file  Transportation Needs: Not on file  Physical Activity: Not on file  Stress: Not on file  Social Connections: Not on file  Intimate Partner Violence: Not on file   Family Status  Relation Name Status  . Mother  Alive  . Father  Alive  . Sister  Alive  . Sister  Alive  . Sister  Alive  . Sister  Alive  . MGM  (Not Specified)   Family History  Problem Relation Age of Onset  . Irregular heart beat Mother   . Scoliosis Sister   . Stomach cancer Maternal Grandmother    No Known Allergies  Patient Care Team: Jerrol Banana., MD as PCP - General (Family Medicine)   Medications: Outpatient Medications Prior to Visit  Medication Sig  . cholecalciferol (VITAMIN D) 1000 units tablet Take by mouth. Reported on 04/25/2016  . MULTIPLE VITAMINS-MINERALS PO   . OMEGA-3 FATTY ACIDS PO   . [DISCONTINUED] amLODipine (NORVASC) 5 MG tablet TAKE 1 TABLET BY MOUTH EVERY DAY  . [DISCONTINUED] lisinopril-hydrochlorothiazide (ZESTORETIC) 20-12.5 MG tablet TAKE 1 TABLET BY MOUTH EVERY DAY  . naproxen (NAPROSYN) 500 MG tablet Take 1 tablet (500 mg total) by mouth 2 (two) times daily as needed. (  Patient not taking: No sig reported)   No facility-administered medications prior to visit.    Review of Systems  Musculoskeletal: Positive for back pain.  All other systems reviewed and are negative.      Objective    BP (!) 185/121 (BP Location: Left Arm, Cuff Size: Large)   Pulse (!) 55   Temp 98.3 F (36.8 C) (Oral)   Resp 16   Ht _0  (1.854 m)   Wt 220 lb (99.8 kg)   SpO2 97%   BMI 29.03 kg/m  BP Readings from Last 3 Encounters:  03/26/21 (!) 185/121  12/02/19 130/80  09/17/18 132/90   Wt Readings from Last 3 Encounters:  03/26/21 220 lb (99.8 kg)  12/02/19 218 lb (98.9 kg)  09/17/18 214 lb (97.1 kg)      Physical Exam Vitals reviewed.  Constitutional:      Appearance:  Normal appearance.  HENT:     Head: Normocephalic and atraumatic.     Right Ear: Tympanic membrane and external ear normal.     Left Ear: Tympanic membrane and external ear normal.     Nose: Nose normal.  Eyes:     General: No scleral icterus.    Conjunctiva/sclera: Conjunctivae normal.  Neck:     Vascular: No carotid bruit.  Cardiovascular:     Rate and Rhythm: Normal rate and regular rhythm.     Pulses: Normal pulses.     Heart sounds: Normal heart sounds.  Pulmonary:     Effort: Pulmonary effort is normal.     Breath sounds: Normal breath sounds.  Abdominal:     Palpations: Abdomen is soft.  Genitourinary:    Penis: Normal.      Testes: Normal.     Prostate: Normal.     Rectum: Normal.  Musculoskeletal:     Right lower leg: No edema.     Left lower leg: No edema.     Comments: He has minimal tenderness over the lower thoracic paraspinal muscles which reproduces the discomfort he has.  No other abnormality on exam.  Lymphadenopathy:     Cervical: No cervical adenopathy.  Skin:    General: Skin is warm and dry.     Comments: The lesion his wife is concerned about is a seborrheic keratosis on the left shoulder/scapula.  Neurological:     General: No focal deficit present.     Mental Status: He is alert and oriented to person, place, and time.  Psychiatric:        Mood and Affect: Mood normal.        Behavior: Behavior normal.        Thought Content: Thought content normal.        Judgment: Judgment normal.       Last depression screening scores PHQ 2/9 Scores 03/26/2021 12/02/2019 09/17/2018  PHQ - 2 Score 0 0 0  PHQ- 9 Score 1 2 -   Last fall risk screening Fall Risk  09/17/2018  Falls in the past year? Yes  Number falls in past yr: 1  Injury with Fall? Yes   Last Audit-C alcohol use screening Alcohol Use Disorder Test (AUDIT) 03/26/2021  1. How often do you have a drink containing alcohol? 3  2. How many drinks containing alcohol do you have on a typical day  when you are drinking? 0  3. How often do you have six or more drinks on one occasion? 1  AUDIT-C Score 4  4. How often during the  last year have you found that you were not able to stop drinking once you had started? 0  5. How often during the last year have you failed to do what was normally expected from you because of drinking? 0  6. How often during the last year have you needed a first drink in the morning to get yourself going after a heavy drinking session? 0  7. How often during the last year have you had a feeling of guilt of remorse after drinking? 0  8. How often during the last year have you been unable to remember what happened the night before because you had been drinking? 0  9. Have you or someone else been injured as a result of your drinking? 0  10. Has a relative or friend or a doctor or another health worker been concerned about your drinking or suggested you cut down? 0  Alcohol Use Disorder Identification Test Final Score (AUDIT) 4   A score of 3 or more in women, and 4 or more in men indicates increased risk for alcohol abuse, EXCEPT if all of the points are from question 1   Results for orders placed or performed in visit on 03/26/21  POCT urinalysis dipstick  Result Value Ref Range   Color, UA Yellow    Clarity, UA Clear    Glucose, UA Negative Negative   Bilirubin, UA Negative    Ketones, UA Negative    Spec Grav, UA 1.020 1.010 - 1.025   Blood, UA Negative    pH, UA 6.5 5.0 - 8.0   Protein, UA Negative Negative   Urobilinogen, UA 0.2 0.2 or 1.0 E.U./dL   Nitrite, UA Negative    Leukocytes, UA Negative Negative  IFOBT POC (occult bld, rslt in office)  Result Value Ref Range   IFOBT Negative     Assessment & Plan    Routine Health Maintenance and Physical Exam  Exercise Activities and Dietary recommendations Goals   None     Immunization History  Administered Date(s) Administered  . Hepatitis A 06/27/2000, 01/09/2001  . Hepatitis B 06/27/2000,  01/09/2001, 10/25/2004  . Janssen (J&J) SARS-COV-2 Vaccination 04/25/2020  . MMR 10/25/2004  . Td 10/25/2004  . Tdap 04/25/2016  . Typhoid Inactivated 10/25/2004  . Typhoid Live 06/27/2000  . Yellow Fever 01/09/2001    Health Maintenance  Topic Date Due  . HIV Screening  Never done  . COVID-19 Vaccine (2 - Booster for [employer] series) 06/20/2020  . INFLUENZA VACCINE  07/16/2021  . COLONOSCOPY (Pts 45-50yr Insurance coverage will need to be confirmed)  10/06/2022  . TETANUS/TDAP  04/25/2026  . Hepatitis C Screening  Completed  . HPV VACCINES  Aged Out    Discussed health benefits of physical activity, and encouraged him to engage in regular exercise appropriate for his age and condition.  1. Annual physical exam  - CBC with Differential/Platelet - Comprehensive metabolic panel - Lipid panel - TSH - POCT urinalysis dipstick  2. Encounter for screening fecal occult blood testing  - IFOBT POC (occult bld, rslt in office)--Negative  3. Screening for prostate cancer  - PSA  4. Screening for blood or protein in urine  - POCT urinalysis dipstick--Negative  5. Essential hypertension Double both with medications and follow-up in 2 months. Increased amlodipine from 5 mg qd to 10 mg qd. Also increased lisinopril-hydrochlorothiazide 20-12.5 mg to bid. - amLODipine (NORVASC) 10 MG tablet; Take 1 tablet (10 mg total) by mouth daily.  Dispense: 30 tablet;  Refill: 2 - CT CARDIAC SCORING (SELF PAY ONLY) - lisinopril-hydrochlorothiazide (ZESTORETIC) 20-12.5 MG tablet; Take 1 tablet by mouth in the morning and at bedtime.  Dispense: 60 tablet; Refill: 1  6. Bradycardia Normal range with heart rate 55. Patient's brother-in-law had recent stress test and patient wishes to do this for risk assessment. Without any symptoms told him his best test was probably cardiac calcium CT scoring - CT CARDIAC SCORING (SELF PAY ONLY) 7.  Thoracic back pain Truly think this is musculoskeletal due  to backpack placement.  No work-up at this time.   Return in about 4 weeks (around 04/23/2021).     I, [name], MD, have reviewed all documentation for this visit. The documentation on 03/26/21 for the exam, diagnosis, procedures, and orders are all accurate and complete.    Harriett Azar Cranford Mon, MD  [facility], [place] [phone] (phone) [phone] (fax)  [place]

## 2021-03-26 ENCOUNTER — Ambulatory Visit (INDEPENDENT_AMBULATORY_CARE_PROVIDER_SITE_OTHER): Payer: Managed Care, Other (non HMO) | Admitting: Family Medicine

## 2021-03-26 ENCOUNTER — Encounter: Payer: Self-pay | Admitting: Family Medicine

## 2021-03-26 ENCOUNTER — Other Ambulatory Visit: Payer: Self-pay

## 2021-03-26 VITALS — BP 185/121 | HR 55 | Temp 98.3°F | Resp 16 | Ht 73.0 in | Wt 220.0 lb

## 2021-03-26 DIAGNOSIS — Z1389 Encounter for screening for other disorder: Secondary | ICD-10-CM

## 2021-03-26 DIAGNOSIS — Z125 Encounter for screening for malignant neoplasm of prostate: Secondary | ICD-10-CM

## 2021-03-26 DIAGNOSIS — R001 Bradycardia, unspecified: Secondary | ICD-10-CM

## 2021-03-26 DIAGNOSIS — Z0000 Encounter for general adult medical examination without abnormal findings: Secondary | ICD-10-CM | POA: Diagnosis not present

## 2021-03-26 DIAGNOSIS — M546 Pain in thoracic spine: Secondary | ICD-10-CM | POA: Diagnosis not present

## 2021-03-26 DIAGNOSIS — Z1211 Encounter for screening for malignant neoplasm of colon: Secondary | ICD-10-CM | POA: Diagnosis not present

## 2021-03-26 DIAGNOSIS — G8929 Other chronic pain: Secondary | ICD-10-CM | POA: Diagnosis not present

## 2021-03-26 DIAGNOSIS — I10 Essential (primary) hypertension: Secondary | ICD-10-CM

## 2021-03-26 LAB — POCT URINALYSIS DIPSTICK
Bilirubin, UA: NEGATIVE
Blood, UA: NEGATIVE
Glucose, UA: NEGATIVE
Ketones, UA: NEGATIVE
Leukocytes, UA: NEGATIVE
Nitrite, UA: NEGATIVE
Protein, UA: NEGATIVE
Spec Grav, UA: 1.02 (ref 1.010–1.025)
Urobilinogen, UA: 0.2 E.U./dL
pH, UA: 6.5 (ref 5.0–8.0)

## 2021-03-26 LAB — IFOBT (OCCULT BLOOD): IFOBT: NEGATIVE

## 2021-03-26 MED ORDER — AMLODIPINE BESYLATE 10 MG PO TABS: 10.0000 mg | ORAL_TABLET | Freq: Every day | ORAL | 2 refills | Status: DC

## 2021-03-26 MED ORDER — LISINOPRIL-HYDROCHLOROTHIAZIDE 20-12.5 MG PO TABS: 1.0000 | ORAL_TABLET | Freq: Two times a day (BID) | ORAL | 1 refills | Status: DC

## 2021-03-26 MED ORDER — LISINOPRIL-HYDROCHLOROTHIAZIDE 20-12.5 MG PO TABS: 1.0000 | ORAL_TABLET | Freq: Every day | ORAL | 1 refills | Status: DC

## 2021-03-27 ENCOUNTER — Telehealth: Payer: Self-pay

## 2021-03-27 LAB — LIPID PANEL
Chol/HDL Ratio: 5.8 ratio — ABNORMAL HIGH (ref 0.0–5.0)
Cholesterol, Total: 250 mg/dL — ABNORMAL HIGH (ref 100–199)
HDL: 43 mg/dL (ref >39)
LDL Chol Calc (NIH): 171 mg/dL — ABNORMAL HIGH (ref 0–99)
Triglycerides: 196 mg/dL — ABNORMAL HIGH (ref 0–149)
VLDL Cholesterol Cal: 36 mg/dL (ref 5–40)

## 2021-03-27 LAB — CBC WITH DIFFERENTIAL/PLATELET
Basophils Absolute: 0.1 10*3/uL (ref 0.0–0.2)
Basos: 1 %
EOS (ABSOLUTE): 0.3 10*3/uL (ref 0.0–0.4)
Eos: 4 %
Hematocrit: 51.4 % — ABNORMAL HIGH (ref 37.5–51.0)
Hemoglobin: 17.3 g/dL (ref 13.0–17.7)
Immature Grans (Abs): 0 10*3/uL (ref 0.0–0.1)
Immature Granulocytes: 0 %
Lymphocytes Absolute: 2.5 10*3/uL (ref 0.7–3.1)
Lymphs: 35 %
MCH: 31.8 pg (ref 26.6–33.0)
MCHC: 33.7 g/dL (ref 31.5–35.7)
MCV: 95 fL (ref 79–97)
Monocytes Absolute: 0.7 10*3/uL (ref 0.1–0.9)
Monocytes: 10 %
Neutrophils Absolute: 3.7 10*3/uL (ref 1.4–7.0)
Neutrophils: 50 %
Platelets: 235 10*3/uL (ref 150–450)
RBC: 5.44 x10E6/uL (ref 4.14–5.80)
RDW: 13 % (ref 11.6–15.4)
WBC: 7.2 10*3/uL (ref 3.4–10.8)

## 2021-03-27 LAB — COMPREHENSIVE METABOLIC PANEL
ALT: 30 IU/L (ref 0–44)
AST: 21 IU/L (ref 0–40)
Albumin/Globulin Ratio: 1.6 (ref 1.2–2.2)
Albumin: 4.8 g/dL (ref 3.8–4.9)
Alkaline Phosphatase: 52 IU/L (ref 44–121)
BUN/Creatinine Ratio: 11 (ref 10–24)
BUN: 10 mg/dL (ref 8–27)
Bilirubin Total: 1 mg/dL (ref 0.0–1.2)
CO2: 23 mmol/L (ref 20–29)
Calcium: 10.1 mg/dL (ref 8.6–10.2)
Chloride: 102 mmol/L (ref 96–106)
Creatinine, Ser: 0.95 mg/dL (ref 0.76–1.27)
Globulin, Total: 3 g/dL (ref 1.5–4.5)
Glucose: 99 mg/dL (ref 65–99)
Potassium: 5.2 mmol/L (ref 3.5–5.2)
Sodium: 140 mmol/L (ref 134–144)
Total Protein: 7.8 g/dL (ref 6.0–8.5)
eGFR: 92 mL/min/{1.73_m2} (ref >59)

## 2021-03-27 LAB — TSH: TSH: 4.41 u[IU]/mL (ref 0.450–4.500)

## 2021-03-27 LAB — PSA: Prostate Specific Ag, Serum: 1.1 ng/mL (ref 0.0–4.0)

## 2021-03-27 NOTE — Telephone Encounter (Signed)
Called patient and no answer. [place] if patient calls back okay for [facility] to advise.

## 2021-03-27 NOTE — Telephone Encounter (Signed)
-----   Message from Jerrol Banana., MD sent at 03/27/2021  8:15 AM EDT ----- Labs stable.  Cholesterol moderately elevated.  Consider adding Metamucil daily.

## 2021-03-29 NOTE — Telephone Encounter (Signed)
Seen by patient Ronnie Campos on 03/28/2021  5:09 PM

## 2021-04-09 ENCOUNTER — Other Ambulatory Visit: Payer: Self-pay | Admitting: Family Medicine

## 2021-04-09 DIAGNOSIS — I10 Essential (primary) hypertension: Secondary | ICD-10-CM

## 2021-04-23 NOTE — Progress Notes (Incomplete)
      Established patient visit   Patient: Ronnie Campos   DOB: [birth year]   60 y.o. Male  MRN: [id number] Visit Date: 04/24/2021  Today's healthcare provider: [name], MD   No chief complaint on file.  Subjective    HPI  Hypertension, follow-up  BP Readings from Last 3 Encounters:  03/26/21 (!) 185/121  12/02/19 130/80  09/17/18 132/90   Wt Readings from Last 3 Encounters:  03/26/21 220 lb (99.8 kg)  12/02/19 218 lb (98.9 kg)  09/17/18 214 lb (97.1 kg)     He was last seen for hypertension {NUMBERS 1-12:18279} {days/wks/mos/yrs:310907} ago.  BP at that visit was ***. Management since that visit includes ***.  He reports {excellent/good/fair/poor:19665} compliance with treatment. He {is/is not:9024} having side effects. {document side effects if present:1} He is following a {diet:21022986} diet. He {is/is not:9024} exercising. He {does/does not:200015} smoke.  Use of agents associated with hypertension: {bp agents assoc with hypertension:511::"none"}.   Outside blood pressures are {***enter patient reported home BP readings, or 'not being checked':1}. Symptoms: {Yes/No:20286} chest pain {Yes/No:20286} chest pressure  {Yes/No:20286} palpitations {Yes/No:20286} syncope  {Yes/No:20286} dyspnea {Yes/No:20286} orthopnea  {Yes/No:20286} paroxysmal nocturnal dyspnea {Yes/No:20286} lower extremity edema   Pertinent labs: Lab Results  Component Value Date   CHOL 250 (H) 03/26/2021   HDL 43 03/26/2021   LDLCALC 171 (H) 03/26/2021   TRIG 196 (H) 03/26/2021   CHOLHDL 5.8 (H) 03/26/2021   Lab Results  Component Value Date   NA 140 03/26/2021   K 5.2 03/26/2021   CREATININE 0.95 03/26/2021   GFRNONAA 83 06/16/2017   GFRAA 96 06/16/2017   GLUCOSE 99 03/26/2021     The 10-year ASCVD risk score Mikey Bussing DC Jr., et al., 2013) is: 24.7%   {Show patient history (optional):23778::" "}   Medications: Outpatient Medications Prior to Visit  Medication Sig  .  amLODipine (NORVASC) 10 MG tablet Take 1 tablet (10 mg total) by mouth daily.  . cholecalciferol (VITAMIN D) 1000 units tablet Take by mouth. Reported on 04/25/2016  . lisinopril-hydrochlorothiazide (ZESTORETIC) 20-12.5 MG tablet Take 1 tablet by mouth in the morning and at bedtime.  . MULTIPLE VITAMINS-MINERALS PO   . naproxen (NAPROSYN) 500 MG tablet Take 1 tablet (500 mg total) by mouth 2 (two) times daily as needed. (Patient not taking: No sig reported)  . OMEGA-3 FATTY ACIDS PO    No facility-administered medications prior to visit.    Review of Systems  {Labs  Heme  Chem  Endocrine  Serology  Results Review (optional):23779::" "}   Objective    There were no vitals taken for this visit. {Show previous vital signs (optional):23777::" "}   Physical Exam  ***  No results found for any visits on 04/24/21.  Assessment & Plan     ***  No follow-ups on file.      {provider attestation***:1}   [name], MD  [facility] [phone] (phone) [phone] (fax)  [place]

## 2021-04-24 ENCOUNTER — Ambulatory Visit: Payer: Self-pay | Admitting: Family Medicine

## 2021-05-11 ENCOUNTER — Ambulatory Visit
Admission: RE | Admit: 2021-05-11 | Discharge: 2021-05-11 | Disposition: A | Discharge status: Home / Self Care | Payer: Self-pay | Source: Ambulatory Visit | Attending: Family Medicine | Admitting: Family Medicine

## 2021-05-11 ENCOUNTER — Other Ambulatory Visit: Payer: Self-pay

## 2021-05-11 DIAGNOSIS — R001 Bradycardia, unspecified: Secondary | ICD-10-CM | POA: Insufficient documentation

## 2021-05-11 DIAGNOSIS — I10 Essential (primary) hypertension: Secondary | ICD-10-CM | POA: Insufficient documentation

## 2021-05-14 ENCOUNTER — Other Ambulatory Visit: Payer: Self-pay | Admitting: Family Medicine

## 2021-05-14 DIAGNOSIS — I10 Essential (primary) hypertension: Secondary | ICD-10-CM

## 2021-05-15 ENCOUNTER — Ambulatory Visit: Admission: RE | Admit: 2021-05-15 | Payer: Self-pay | Source: Ambulatory Visit

## 2021-05-22 ENCOUNTER — Other Ambulatory Visit: Payer: Self-pay | Admitting: Family Medicine

## 2021-05-22 DIAGNOSIS — I10 Essential (primary) hypertension: Secondary | ICD-10-CM

## 2021-05-22 NOTE — Telephone Encounter (Signed)
Attempted to call patient to schedule appointment for follow up per note- left message to call office- courtesy RF 30 day given

## 2021-06-22 ENCOUNTER — Other Ambulatory Visit: Payer: Self-pay | Admitting: Family Medicine

## 2021-06-22 DIAGNOSIS — I10 Essential (primary) hypertension: Secondary | ICD-10-CM

## 2021-07-09 ENCOUNTER — Other Ambulatory Visit: Payer: Self-pay | Admitting: Family Medicine

## 2021-07-09 DIAGNOSIS — I10 Essential (primary) hypertension: Secondary | ICD-10-CM

## 2021-07-09 NOTE — Telephone Encounter (Signed)
   Notes to clinic:  REQUEST FOR 90 DAYS PRESCRIPTION   Requested Prescriptions  Pending Prescriptions Disp Refills   lisinopril-hydrochlorothiazide (ZESTORETIC) 20-12.5 MG tablet [Pharmacy Med Name: LISINOPRIL-HCTZ 20-12.5 MG TAB] 90 tablet 1    Sig: TAKE 1 TABLET BY MOUTH EVERY DAY      Cardiovascular:  ACEI + Diuretic Combos Failed - 07/09/2021 12:50 PM      Failed - Last BP in normal range    BP Readings from Last 1 Encounters:  03/26/21 (!) 185/121          Passed - Na in normal range and within 180 days    Sodium  Date Value Ref Range Status  03/26/2021 140 134 - 144 mmol/L Final          Passed - K in normal range and within 180 days    Potassium  Date Value Ref Range Status  03/26/2021 5.2 3.5 - 5.2 mmol/L Final          Passed - Cr in normal range and within 180 days    Creatinine, Ser  Date Value Ref Range Status  03/26/2021 0.95 0.76 - 1.27 mg/dL Final          Passed - Ca in normal range and within 180 days    Calcium  Date Value Ref Range Status  03/26/2021 10.1 8.6 - 10.2 mg/dL Final   Calcium, Ion  Date Value Ref Range Status  06/16/2017 5.3 4.5 - 5.6 mg/dL Final          Passed - Patient is not pregnant      Passed - Valid encounter within last 6 months    Recent Outpatient Visits           3 months ago Annual physical exam   [facility] Jerrol Banana., [facility]   1 year ago Annual physical exam   [facility] Jerrol Banana., MD   2 years ago Right hand pain   [facility] Jerrol Banana., [facility]   4 years ago Annual physical exam   [facility] Chrismon, Vickki Muff, [place]   5 years ago Essential hypertension   [facility] Jerrol Banana., MD

## 2021-08-10 ENCOUNTER — Other Ambulatory Visit: Payer: Self-pay | Admitting: Family Medicine

## 2021-08-10 DIAGNOSIS — I10 Essential (primary) hypertension: Secondary | ICD-10-CM

## 2021-08-10 NOTE — Telephone Encounter (Signed)
Requested medications are due for refill today yes  Requested medications are on the active medication list yes  Last refill 05/15/21  Last visit 03/26/21, was to return in 4 weeks (BP 185/121) did not return  Future visit scheduled no  Notes to clinic HTN, did not return, no upcoming appt please assess.

## 2021-10-18 ENCOUNTER — Other Ambulatory Visit: Payer: Self-pay | Admitting: Family Medicine

## 2021-10-18 DIAGNOSIS — I10 Essential (primary) hypertension: Secondary | ICD-10-CM

## 2021-10-18 NOTE — Telephone Encounter (Signed)
Requested medications are due for refill today yes  Requested medications are on the active medication list yes  Last refill 9/11 (30 day)  Last visit 03/26/21  Future visit scheduled No  Notes to clinic was asked to return in 4 weeks, May, did not return, lab is greater than 180 days ago, no upcoming visit, please assess.  Requested Prescriptions  Pending Prescriptions Disp Refills   lisinopril-hydrochlorothiazide (ZESTORETIC) 20-12.5 MG tablet [Pharmacy Med Name: LISINOPRIL-HCTZ 20-12.5 MG TAB] 60 tablet     Sig: TAKE 1 TABLET BY MOUTH EVERY DAY     Cardiovascular:  ACEI + Diuretic Combos Failed - 10/18/2021  2:34 PM      Failed - Na in normal range and within 180 days    Sodium  Date Value Ref Range Status  03/26/2021 140 134 - 144 mmol/L Final          Failed - K in normal range and within 180 days    Potassium  Date Value Ref Range Status  03/26/2021 5.2 3.5 - 5.2 mmol/L Final          Failed - Cr in normal range and within 180 days    Creatinine, Ser  Date Value Ref Range Status  03/26/2021 0.95 0.76 - 1.27 mg/dL Final          Failed - Ca in normal range and within 180 days    Calcium  Date Value Ref Range Status  03/26/2021 10.1 8.6 - 10.2 mg/dL Final   Calcium, Ion  Date Value Ref Range Status  06/16/2017 5.3 4.5 - 5.6 mg/dL Final          Failed - Last BP in normal range    BP Readings from Last 1 Encounters:  03/26/21 (!) 185/121          Failed - Valid encounter within last 6 months    Recent Outpatient Visits           6 months ago Annual physical exam   [facility] Jerrol Banana., MD   1 year ago Annual physical exam   [facility] Jerrol Banana., MD   3 years ago Right hand pain   [facility] Jerrol Banana., [place]   4 years ago Annual physical exam   [facility] Chrismon, Vickki Muff, [place]   5 years ago Essential hypertension   [facility]  Jerrol Banana., MD              Passed - Patient is not pregnant

## 2021-10-25 ENCOUNTER — Other Ambulatory Visit: Payer: Self-pay | Admitting: Family Medicine

## 2021-10-25 DIAGNOSIS — I10 Essential (primary) hypertension: Secondary | ICD-10-CM

## 2021-10-25 NOTE — Telephone Encounter (Signed)
Requested medications are due for refill today yes  Requested medications are on the active medication list yes  Last refill 08/10/21  Last visit 03/26/21  Future visit scheduled NO  Notes to clinic Has already had a curtesy refill and there is no upcoming appointment scheduled. Was to return in 4 weeks due to bp 185/121, has not returned, already given 3 month refill, no upcoming appt still.  Requested Prescriptions  Pending Prescriptions Disp Refills   amLODipine (NORVASC) 10 MG tablet [Pharmacy Med Name: AMLODIPINE BESYLATE 10 MG TAB] 90 tablet 0    Sig: TAKE 1 TABLET BY MOUTH EVERY DAY     Cardiovascular:  Calcium Channel Blockers Failed - 10/25/2021  3:42 PM      Failed - Last BP in normal range    BP Readings from Last 1 Encounters:  03/26/21 (!) 185/121          Failed - Valid encounter within last 6 months    Recent Outpatient Visits           7 months ago Annual physical exam   [facility] Jerrol Banana., [facility]   1 year ago Annual physical exam   [facility] Jerrol Banana., MD   3 years ago Right hand pain   [facility] Jerrol Banana., MD   4 years ago Annual physical exam   [facility] Chrismon, Vickki Muff, [place]   5 years ago Essential hypertension   [facility] Jerrol Banana., MD

## 2022-04-03 ENCOUNTER — Other Ambulatory Visit: Payer: Self-pay | Admitting: Family Medicine

## 2022-04-03 DIAGNOSIS — I10 Essential (primary) hypertension: Secondary | ICD-10-CM

## 2022-04-03 MED ORDER — LISINOPRIL-HYDROCHLOROTHIAZIDE 20-12.5 MG PO TABS: 1.0000 | ORAL_TABLET | Freq: Every day | ORAL | 0 refills | Status: DC

## 2022-04-03 MED ORDER — AMLODIPINE BESYLATE 10 MG PO TABS: 10.0000 mg | ORAL_TABLET | Freq: Every day | ORAL | 0 refills | Status: DC

## 2022-04-03 NOTE — Telephone Encounter (Signed)
Requested medication (s) are due for refill today:   Yes for both ? ?Requested medication (s) are on the active medication list:   Yes for both ? ?Future visit scheduled:   Yes  First available is 09/05/2022 with Dr. Rosanna Randy ? ? ?Last ordered: Zestoretic 10/19/2021 #90, 1 refill;  amlodipine 08/10/2021 #90, 0 refills ? ?Returned because labs overdue, invalid encounter within 6 months, pt requesting 90 day supplies because [place] a lot.    ? ?Requested Prescriptions  ?Pending Prescriptions Disp Refills  ? lisinopril-hydrochlorothiazide (ZESTORETIC) 20-12.5 MG tablet 90 tablet 1  ?  Sig: Take 1 tablet by mouth daily.  ?  ? Cardiovascular:  ACEI + Diuretic Combos Failed - 04/03/2022  9:45 AM  ?  ?  Failed - Na in normal range and within 180 days  ?  Sodium  ?Date Value Ref Range Status  ?03/26/2021 140 134 - 144 mmol/L Final  ?  ?  ?  ?  Failed - K in normal range and within 180 days  ?  Potassium  ?Date Value Ref Range Status  ?03/26/2021 5.2 3.5 - 5.2 mmol/L Final  ?  ?  ?  ?  Failed - Cr in normal range and within 180 days  ?  Creatinine, Ser  ?Date Value Ref Range Status  ?03/26/2021 0.95 0.76 - 1.27 mg/dL Final  ?  ?  ?  ?  Failed - eGFR is 30 or above and within 180 days  ?  GFR calc Af Amer  ?Date Value Ref Range Status  ?06/16/2017 96 >59 mL/min/1.73 Final  ? ?GFR calc non Af Amer  ?Date Value Ref Range Status  ?06/16/2017 83 >59 mL/min/1.73 Final  ? ?eGFR  ?Date Value Ref Range Status  ?03/26/2021 92 >59 mL/min/1.73 Final  ?  ?  ?  ?  Failed - Last BP in normal range  ?  BP Readings from Last 1 Encounters:  ?03/26/21 (!) 185/121  ?  ?  ?  ?  Failed - Valid encounter within last 6 months  ?  Recent Outpatient Visits   ? ?      ? 1 year ago Annual physical exam  ? [facility] Jerrol Banana., [facility]  ? 2 years ago Annual physical exam  ? [facility] Jerrol Banana., MD  ? 3 years ago Right hand pain  ? [facility] Jerrol Banana., MD  ? 4  years ago Annual physical exam  ? [place], PA-C  ? 5 years ago Essential hypertension  ? [facility] Jerrol Banana., MD  ? ?  ?  ?Future Appointments   ? ?        ? In 5 months Jerrol Banana., MD [facility]  ? ?  ? ? ?  ?  ?  Passed - Patient is not pregnant  ?  ?  ? amLODipine (NORVASC) 10 MG tablet 90 tablet 0  ?  Sig: Take 1 tablet (10 mg total) by mouth daily.  ?  ? Cardiovascular: Calcium Channel Blockers 2 Failed - 04/03/2022  9:45 AM  ?  ?  Failed - Last BP in normal range  ?  BP Readings from Last 1 Encounters:  ?03/26/21 (!) 185/121  ?  ?  ?  ?  Failed - Valid encounter within last 6 months  ?  Recent Outpatient Visits   ? ?      ?  1 year ago Annual physical exam  ? [facility] Jerrol Banana., MD  ? 2 years ago Annual physical exam  ? [facility] Jerrol Banana., MD  ? 3 years ago Right hand pain  ? [facility] Jerrol Banana., MD  ? 4 years ago Annual physical exam  ? [place], PA-C  ? 5 years ago Essential hypertension  ? [facility] Jerrol Banana., MD  ? ?  ?  ?Future Appointments   ? ?        ? In 5 months Jerrol Banana., MD [facility]  ? ?  ? ? ?  ?  ?  Passed - Last Heart Rate in normal range  ?  Pulse Readings from Last 1 Encounters:  ?03/26/21 (!) 55  ?  ?  ?  ?  ? ?

## 2022-04-03 NOTE — Telephone Encounter (Signed)
Medication Refill - Medication: lisinopril-hydrochlorothiazide (ZESTORETIC) 20-12.5 MG tablet ?amLODipine (NORVASC) 10 MG tablet ? ?Has the patient contacted their pharmacy? No. ?Pt states he travels [place] all the time. ?Would like 90 day refill ? ?Preferred Pharmacy (with phone number or street name): [id number] [place] ?Has the patient been seen for an appointment in the last year OR does the patient have an upcoming appointment? Yes.   ? ?Pt scheduled his cpe, the first available 09/05/2022 ?

## 2022-06-03 NOTE — Addendum Note (Signed)
Encounter addended by: Annie Paras on: 06/03/2022 3:53 PM  Actions taken: Letter saved

## 2022-07-04 ENCOUNTER — Other Ambulatory Visit: Payer: Self-pay | Admitting: Family Medicine

## 2022-07-04 DIAGNOSIS — I10 Essential (primary) hypertension: Secondary | ICD-10-CM

## 2022-09-05 ENCOUNTER — Ambulatory Visit (INDEPENDENT_AMBULATORY_CARE_PROVIDER_SITE_OTHER): Payer: BC Managed Care – PPO | Admitting: Family Medicine

## 2022-09-05 ENCOUNTER — Encounter: Payer: Self-pay | Admitting: Family Medicine

## 2022-09-05 VITALS — BP 140/98 | HR 64 | Resp 16 | Ht 73.0 in | Wt 219.0 lb

## 2022-09-05 DIAGNOSIS — I10 Essential (primary) hypertension: Secondary | ICD-10-CM

## 2022-09-05 DIAGNOSIS — Z23 Encounter for immunization: Secondary | ICD-10-CM | POA: Diagnosis not present

## 2022-09-05 DIAGNOSIS — Z0000 Encounter for general adult medical examination without abnormal findings: Secondary | ICD-10-CM | POA: Diagnosis not present

## 2022-09-05 DIAGNOSIS — B079 Viral wart, unspecified: Secondary | ICD-10-CM

## 2022-09-05 DIAGNOSIS — Z125 Encounter for screening for malignant neoplasm of prostate: Secondary | ICD-10-CM | POA: Diagnosis not present

## 2022-09-05 DIAGNOSIS — G5711 Meralgia paresthetica, right lower limb: Secondary | ICD-10-CM

## 2022-09-05 MED ORDER — LISINOPRIL-HYDROCHLOROTHIAZIDE 20-25 MG PO TABS: 1.0000 | ORAL_TABLET | Freq: Every day | ORAL | 3 refills | Status: AC

## 2022-09-05 NOTE — Progress Notes (Unsigned)
Complete physical exam  I,Ronnie Campos,acting as a scribe for Ronnie Durie, MD.,have documented all relevant documentation on the behalf of Ronnie Durie, MD,as directed by  Ronnie Durie, MD while in the presence of Ronnie Durie, MD.   Patient: Ronnie Campos   DOB: 1961-02-03   61 y.o. Male  MRN: [id number] Visit Date: 09/05/2022  Today's healthcare provider: [name], MD   Chief Complaint  Patient presents with   Annual Exam   Subjective    Ronnie Campos is a 61 y.o. male who presents today for a complete physical exam.  Patient is single.  He just got back from spending the summer in [place] with his work.  He has to travel a lot and he tries to work out some. Have a wart on his right hand and he has some meralgia paresthetica symptoms of the right thigh.  History reviewed. No pertinent past medical history. Past Surgical History:  Procedure Laterality Date   HERNIA REPAIR     Social History   Socioeconomic History   Marital status: Single    Spouse name: Not on file   Number of children: Not on file   Years of education: Not on file   Highest education level: Not on file  Occupational History   Not on file  Tobacco Use   Smoking status: Never   Smokeless tobacco: Never  Vaping Use   Vaping Use: Never used  Substance and Sexual Activity   Alcohol use: Yes    Alcohol/week: 0.0 standard drinks of alcohol   Drug use: No   Sexual activity: Yes    Birth control/protection: None  Other Topics Concern   Not on file  Social History Narrative   Not on file   Social Determinants of Health   Financial Resource Strain: Not on file  Food Insecurity: Not on file  Transportation Needs: Not on file  Physical Activity: Not on file  Stress: Not on file  Social Connections: Not on file  Intimate Partner Violence: Not on file   Family Status  Relation Name Status   Mother  Alive   Father  Alive   Sister  Alive   Sister  Alive    Sister  Alive   Sister  Alive   MGM  (Not Specified)   Family History  Problem Relation Age of Onset   Irregular heart beat Mother    Scoliosis Sister    Stomach cancer Maternal Grandmother    No Known Allergies  Patient Care Team: Jerrol Banana., MD as PCP - General (Family Medicine)   Medications: Outpatient Medications Prior to Visit  Medication Sig   amLODipine (NORVASC) 10 MG tablet TAKE 1 TABLET BY MOUTH EVERY DAY   cholecalciferol (VITAMIN D) 1000 units tablet Take by mouth. Reported on 04/25/2016   lisinopril-hydrochlorothiazide (ZESTORETIC) 10-12.5 MG tablet    MULTIPLE VITAMINS-MINERALS PO    OMEGA-3 FATTY ACIDS PO    [DISCONTINUED] lisinopril-hydrochlorothiazide (ZESTORETIC) 20-12.5 MG tablet Take 1 tablet by mouth daily.   [DISCONTINUED] naproxen (NAPROSYN) 500 MG tablet Take 1 tablet (500 mg total) by mouth 2 (two) times daily as needed. (Patient not taking: Reported on 12/02/2019)   No facility-administered medications prior to visit.    Review of Systems  Musculoskeletal:  Positive for neck stiffness.  Neurological:  Positive for numbness.  All other systems reviewed and are negative.   Last metabolic panel Lab Results  Component Value Date  GLUCOSE 82 09/05/2022   NA 139 09/05/2022   K 4.4 09/05/2022   CL 101 09/05/2022   CO2 24 09/05/2022   BUN 9 09/05/2022   CREATININE 0.98 09/05/2022   EGFR 88 09/05/2022   CALCIUM 10.1 09/05/2022   PHOS 3.5 06/16/2017   PROT 7.7 09/05/2022   ALBUMIN 4.9 09/05/2022   LABGLOB 2.8 09/05/2022   AGRATIO 1.8 09/05/2022   BILITOT 1.1 09/05/2022   ALKPHOS 59 09/05/2022   AST 23 09/05/2022   ALT 39 09/05/2022      Objective     BP (!) 140/98 (BP Location: Left Arm, Patient Position: Sitting, Cuff Size: Large)   Pulse 64   Resp 16   Ht _0  (1.854 m)   Wt 219 lb (99.3 kg)   SpO2 96%   BMI 28.89 kg/m  BP Readings from Last 3 Encounters:  09/05/22 (!) 140/98  03/26/21 (!) 185/121  12/02/19 130/80    Wt Readings from Last 3 Encounters:  09/05/22 219 lb (99.3 kg)  03/26/21 220 lb (99.8 kg)  12/02/19 218 lb (98.9 kg)       Physical Exam Constitutional:      Appearance: Normal appearance. He is normal weight.  HENT:     Head: Normocephalic and atraumatic.     Right Ear: Tympanic membrane, ear canal and external ear normal.     Left Ear: Tympanic membrane, ear canal and external ear normal.     Nose: Nose normal.     Mouth/Throat:     Mouth: Mucous membranes are moist.     Pharynx: Oropharynx is clear.  Eyes:     Extraocular Movements: Extraocular movements intact.     Conjunctiva/sclera: Conjunctivae normal.     Pupils: Pupils are equal, round, and reactive to light.  Cardiovascular:     Rate and Rhythm: Normal rate and regular rhythm.     Pulses: Normal pulses.     Heart sounds: Normal heart sounds.  Pulmonary:     Effort: Pulmonary effort is normal.     Breath sounds: Normal breath sounds.  Abdominal:     General: Bowel sounds are normal.     Palpations: Abdomen is soft.  Genitourinary:    Penis: Normal.      Testes: Normal.  Musculoskeletal:     Cervical back: Normal range of motion and neck supple.  Skin:    General: Skin is warm and dry.  Neurological:     General: No focal deficit present.     Mental Status: He is alert and oriented to person, place, and time. Mental status is at baseline.  Psychiatric:        Mood and Affect: Mood normal.        Behavior: Behavior normal.        Thought Content: Thought content normal.        Judgment: Judgment normal.       Last depression screening scores    09/05/2022    1:22 PM 03/26/2021    8:31 AM 12/02/2019    3:29 PM  PHQ 2/9 Scores  PHQ - 2 Score 0 0 0  PHQ- 9 Score 0 1 2   Last fall risk screening    09/05/2022    1:22 PM  [place] in the past year? 0  Number falls in past yr: 0  Injury with Fall? 0  Risk for fall due to : No Fall Risks  Follow up Falls evaluation completed   Last  Audit-C alcohol use screening    09/05/2022    1:21 PM  Alcohol Use Disorder Test (AUDIT)  1. How often do you have a drink containing alcohol? 3  2. How many drinks containing alcohol do you have on a typical day when you are drinking? 0  3. How often do you have six or more drinks on one occasion? 1  AUDIT-C Score 4  4. How often during the last year have you found that you were not able to stop drinking once you had started? 0  5. How often during the last year have you failed to do what was normally expected from you because of drinking? 0  6. How often during the last year have you needed a first drink in the morning to get yourself going after a heavy drinking session? 0  7. How often during the last year have you had a feeling of guilt of remorse after drinking? 0  8. How often during the last year have you been unable to remember what happened the night before because you had been drinking? 0  9. Have you or someone else been injured as a result of your drinking? 0  10. Has a relative or friend or a doctor or another health worker been concerned about your drinking or suggested you cut down? 0  Alcohol Use Disorder Identification Test Final Score (AUDIT) 4   A score of 3 or more in women, and 4 or more in men indicates increased risk for alcohol abuse, EXCEPT if all of the points are from question 1   No results found for any visits on 09/05/22.  Assessment & Plan    Routine Health Maintenance and Physical Exam  Exercise Activities and Dietary recommendations  Goals   None     Immunization History  Administered Date(s) Administered   Hepatitis A 06/27/2000, 01/09/2001   Hepatitis B 06/27/2000, 01/09/2001, 10/25/2004   Janssen (J&J) SARS-COV-2 Vaccination 04/25/2020   MMR 10/25/2004   Td 10/25/2004   Tdap 04/25/2016   Typhoid Inactivated 10/25/2004   Typhoid Live 06/27/2000   Yellow Fever 01/09/2001    Health Maintenance  Topic Date Due   HIV Screening  Never done    Zoster Vaccines- Shingrix (1 of 2) Never done   COVID-19 Vaccine (2 - Booster for Janssen series) 06/20/2020   INFLUENZA VACCINE  Never done   COLONOSCOPY (Pts 45-47yr Insurance coverage will need to be confirmed)  10/06/2022   TETANUS/TDAP  04/25/2026   Hepatitis C Screening  Completed   HPV VACCINES  Aged Out    Discussed health benefits of physical activity, and encouraged him to engage in regular exercise appropriate for his age and condition.  1. Annual physical exam Overall health is very good.  Time for referral to GI for colonoscopy. - CBC with Differential/Platelet - Comprehensive metabolic panel - Lipid Panel With LDL/HDL Ratio - TSH  2. Prostate cancer screening  - PSA  3. Need for influenza vaccination  - Flu Vaccine QUAD 6+ mos PF IM (Fluarix Quad PF)  4. Essential hypertension Double lisinopril/HCT dose.  Follow-up in a few months for hypertension and repeat renal panel  5. Wart of hand Patient wishes no treatment today.  Given him some over-the-counter options  6. Meralgia paresthetica of right side Follow clinically.No  Work-up necessary at this time.   No follow-ups on file.     I, RWilhemena Durie MD, have reviewed all documentation for this visit. The documentation on 09/10/22 for  the exam, diagnosis, procedures, and orders are all accurate and complete.    Tae Vonada Cranford Mon, MD  [facility] [phone] (phone) [phone] (fax)  [place]

## 2022-09-06 LAB — CBC WITH DIFFERENTIAL/PLATELET
Basophils Absolute: 0.1 10*3/uL (ref 0.0–0.2)
Basos: 1 %
EOS (ABSOLUTE): 0.3 10*3/uL (ref 0.0–0.4)
Eos: 3 %
Hematocrit: 49.9 % (ref 37.5–51.0)
Hemoglobin: 16.9 g/dL (ref 13.0–17.7)
Immature Grans (Abs): 0 10*3/uL (ref 0.0–0.1)
Immature Granulocytes: 0 %
Lymphocytes Absolute: 3 10*3/uL (ref 0.7–3.1)
Lymphs: 38 %
MCH: 32.1 pg (ref 26.6–33.0)
MCHC: 33.9 g/dL (ref 31.5–35.7)
MCV: 95 fL (ref 79–97)
Monocytes Absolute: 1 10*3/uL — ABNORMAL HIGH (ref 0.1–0.9)
Monocytes: 12 %
Neutrophils Absolute: 3.6 10*3/uL (ref 1.4–7.0)
Neutrophils: 46 %
Platelets: 233 10*3/uL (ref 150–450)
RBC: 5.26 x10E6/uL (ref 4.14–5.80)
RDW: 12.9 % (ref 11.6–15.4)
WBC: 8 10*3/uL (ref 3.4–10.8)

## 2022-09-06 LAB — COMPREHENSIVE METABOLIC PANEL
ALT: 39 IU/L (ref 0–44)
AST: 23 IU/L (ref 0–40)
Albumin/Globulin Ratio: 1.8 (ref 1.2–2.2)
Albumin: 4.9 g/dL (ref 3.9–4.9)
Alkaline Phosphatase: 59 IU/L (ref 44–121)
BUN/Creatinine Ratio: 9 — ABNORMAL LOW (ref 10–24)
BUN: 9 mg/dL (ref 8–27)
Bilirubin Total: 1.1 mg/dL (ref 0.0–1.2)
CO2: 24 mmol/L (ref 20–29)
Calcium: 10.1 mg/dL (ref 8.6–10.2)
Chloride: 101 mmol/L (ref 96–106)
Creatinine, Ser: 0.98 mg/dL (ref 0.76–1.27)
Globulin, Total: 2.8 g/dL (ref 1.5–4.5)
Glucose: 82 mg/dL (ref 70–99)
Potassium: 4.4 mmol/L (ref 3.5–5.2)
Sodium: 139 mmol/L (ref 134–144)
Total Protein: 7.7 g/dL (ref 6.0–8.5)
eGFR: 88 mL/min/{1.73_m2} (ref >59)

## 2022-09-06 LAB — LIPID PANEL WITH LDL/HDL RATIO
Cholesterol, Total: 261 mg/dL — ABNORMAL HIGH (ref 100–199)
HDL: 42 mg/dL (ref >39)
LDL Chol Calc (NIH): 187 mg/dL — ABNORMAL HIGH (ref 0–99)
LDL/HDL Ratio: 4.5 ratio — ABNORMAL HIGH (ref 0.0–3.6)
Triglycerides: 170 mg/dL — ABNORMAL HIGH (ref 0–149)
VLDL Cholesterol Cal: 32 mg/dL (ref 5–40)

## 2022-09-06 LAB — PSA: Prostate Specific Ag, Serum: 0.9 ng/mL (ref 0.0–4.0)

## 2022-09-06 LAB — TSH: TSH: 3.9 u[IU]/mL (ref 0.450–4.500)

## 2022-09-10 DIAGNOSIS — Z0000 Encounter for general adult medical examination without abnormal findings: Secondary | ICD-10-CM | POA: Diagnosis not present

## 2022-09-10 DIAGNOSIS — Z23 Encounter for immunization: Secondary | ICD-10-CM | POA: Diagnosis not present

## 2022-09-29 ENCOUNTER — Other Ambulatory Visit: Payer: Self-pay | Admitting: Family Medicine

## 2022-09-29 DIAGNOSIS — I10 Essential (primary) hypertension: Secondary | ICD-10-CM

## 2022-12-17 ENCOUNTER — Other Ambulatory Visit: Payer: Self-pay | Admitting: Physician Assistant

## 2022-12-17 DIAGNOSIS — I10 Essential (primary) hypertension: Secondary | ICD-10-CM

## 2022-12-28 ENCOUNTER — Other Ambulatory Visit: Payer: Self-pay | Admitting: Physician Assistant

## 2022-12-30 NOTE — Telephone Encounter (Signed)
Have refused refill request as pt is currently taking lisinopril-hydrochlorothiazide (ZESTORETIC) 20-25 MG and has discontinued other.

## 2023-03-18 ENCOUNTER — Other Ambulatory Visit: Payer: Self-pay | Admitting: Physician Assistant

## 2023-03-18 DIAGNOSIS — I10 Essential (primary) hypertension: Secondary | ICD-10-CM

## 2023-09-12 ENCOUNTER — Other Ambulatory Visit: Payer: Self-pay | Admitting: Physician Assistant

## 2024-06-08 DIAGNOSIS — I10 Essential (primary) hypertension: Secondary | ICD-10-CM | POA: Diagnosis not present

## 2024-06-08 DIAGNOSIS — Z1331 Encounter for screening for depression: Secondary | ICD-10-CM | POA: Diagnosis not present

## 2024-06-08 DIAGNOSIS — Z1211 Encounter for screening for malignant neoplasm of colon: Secondary | ICD-10-CM | POA: Diagnosis not present

## 2024-08-30 DIAGNOSIS — Z1211 Encounter for screening for malignant neoplasm of colon: Secondary | ICD-10-CM | POA: Diagnosis not present

## 2024-09-20 ENCOUNTER — Ambulatory Visit
Admission: RE | Admit: 2024-09-20 | Discharge: 2024-09-20 | Disposition: A | Discharge status: Home / Self Care | Payer: Self-pay | Attending: Gastroenterology | Admitting: Gastroenterology

## 2024-09-20 ENCOUNTER — Encounter
Admission: RE | Disposition: A | Discharge status: Home / Self Care | Payer: Self-pay | Source: Home / Self Care | Attending: Gastroenterology

## 2024-09-20 ENCOUNTER — Ambulatory Visit: Admitting: Certified Registered"

## 2024-09-20 ENCOUNTER — Encounter: Payer: Self-pay | Admitting: Gastroenterology

## 2024-09-20 DIAGNOSIS — Z79899 Other long term (current) drug therapy: Secondary | ICD-10-CM | POA: Insufficient documentation

## 2024-09-20 DIAGNOSIS — D120 Benign neoplasm of cecum: Secondary | ICD-10-CM | POA: Insufficient documentation

## 2024-09-20 DIAGNOSIS — D122 Benign neoplasm of ascending colon: Secondary | ICD-10-CM | POA: Insufficient documentation

## 2024-09-20 DIAGNOSIS — Z1211 Encounter for screening for malignant neoplasm of colon: Secondary | ICD-10-CM | POA: Diagnosis not present

## 2024-09-20 DIAGNOSIS — D124 Benign neoplasm of descending colon: Secondary | ICD-10-CM | POA: Diagnosis not present

## 2024-09-20 DIAGNOSIS — K635 Polyp of colon: Secondary | ICD-10-CM | POA: Diagnosis not present

## 2024-09-20 DIAGNOSIS — K219 Gastro-esophageal reflux disease without esophagitis: Secondary | ICD-10-CM | POA: Diagnosis not present

## 2024-09-20 DIAGNOSIS — K640 First degree hemorrhoids: Secondary | ICD-10-CM | POA: Insufficient documentation

## 2024-09-20 HISTORY — PX: POLYPECTOMY: SHX149

## 2024-09-20 HISTORY — PX: SUBMUCOSAL INJECTION: SHX5543

## 2024-09-20 HISTORY — PX: HEMOSTASIS CLIP PLACEMENT: SHX6857

## 2024-09-20 HISTORY — PX: COLONOSCOPY: SHX5424

## 2024-09-20 SURGERY — COLONOSCOPY
Anesthesia: General

## 2024-09-20 MED ORDER — PROPOFOL 10 MG/ML IV BOLUS
INTRAVENOUS | Status: AC
  Filled 2024-09-20: qty 20

## 2024-09-20 MED ORDER — PROPOFOL 500 MG/50ML IV EMUL
INTRAVENOUS | Status: DC | PRN
  Administered 2024-09-20: 120 mg via INTRAVENOUS
  Administered 2024-09-20: 115 ug/kg/min via INTRAVENOUS

## 2024-09-20 MED ORDER — LIDOCAINE HCL (PF) 2 % IJ SOLN
INTRAMUSCULAR | Status: AC
  Filled 2024-09-20: qty 5

## 2024-09-20 MED ORDER — PROPOFOL 10 MG/ML IV BOLUS
INTRAVENOUS | Status: AC
  Filled 2024-09-20: qty 60

## 2024-09-20 MED ORDER — SODIUM CHLORIDE 0.9% FLUSH
INTRAVENOUS | Status: DC | PRN
  Administered 2024-09-20: 5 mL via INTRADERMAL

## 2024-09-20 MED ORDER — LIDOCAINE HCL (PF) 2 % IJ SOLN
INTRAMUSCULAR | Status: DC | PRN
  Administered 2024-09-20: 100 mg via INTRADERMAL

## 2024-09-20 MED ORDER — SODIUM CHLORIDE 0.9 % IV SOLN
INTRAVENOUS | Status: DC
Start: 2024-09-20 — End: 2024-09-20
  Administered 2024-09-20: 20 mL/h via INTRAVENOUS

## 2024-09-20 NOTE — Op Note (Signed)
 [facility] Gastroenterology Patient Name: Ronnie Campos Procedure Date: 09/20/2024 11:06 AM MRN: [id number] Account #: [id number] Date of Birth: [birth year] Admit Type: Outpatient Age: 63 Room: [facility] ENDO ROOM 1 Gender: Male Note Status: Finalized Instrument Name: Colon Scope [id number] Procedure:             Colonoscopy Indications:           Screening for colorectal malignant neoplasm Providers:             Ruel Kung MD, MD Referring MD:          Charlie CROME. Bertrum, MD (Referring MD) Medicines:             Monitored Anesthesia Care Complications:         No immediate complications. Procedure:             Pre-Anesthesia Assessment:                        - Prior to the procedure, a History and Physical was                         performed, and patient medications, allergies and                         sensitivities were reviewed. The patient's tolerance                         of previous anesthesia was reviewed.                        - The risks and benefits of the procedure and the                         sedation options and risks were discussed with the                         patient. All questions were answered and informed                         consent was obtained.                        - ASA Grade Assessment: II - A patient with mild                         systemic disease.                        After obtaining informed consent, the colonoscope was                         passed under direct vision. Throughout the procedure,                         the patient's blood pressure, pulse, and oxygen                         saturations were monitored continuously. The                         Colonoscope  was introduced through the anus and                         advanced to the the cecum, identified by the                         appendiceal orifice. The colonoscopy was performed                         with ease. The patient tolerated the procedure well.                          The quality of the bowel preparation was excellent.                         The ileocecal valve, appendiceal orifice, and rectum                         were photographed. Findings:      The perianal and digital rectal examinations were normal.      Non-bleeding internal hemorrhoids were found during retroflexion. The       hemorrhoids were medium-sized and Grade I (internal hemorrhoids that do       not prolapse).      Two sessile polyps were found in the descending colon and ascending       colon. The polyps were 5 to 6 mm in size. These polyps were removed with       a cold snare. Resection and retrieval were complete.      A 15 mm polyp was found in the cecum. The polyp was sessile.       Preparations were made for mucosal resection. Demarcation of the lesion       was performed with high-definition white light to clearly identify the       boundaries of the lesion. Saline was injected to raise the lesion. Snare       mucosal resection was performed. Resection and retrieval were complete.       Resected tissue margins were examined and clear of polyp tissue. To       prevent bleeding after mucosal resection, one hemostatic clip was       successfully placed. Clip manufacturer: [employer]. There was no       bleeding during, or at the end, of the procedure.      The exam was otherwise without abnormality on direct and retroflexion       views. Impression:            - Non-bleeding internal hemorrhoids.                        - Two 5 to 6 mm polyps in the descending colon and in                         the ascending colon, removed with a cold snare.                         Resected and retrieved.                        - One 15 mm polyp in the  cecum, removed with mucosal                         resection. Resected and retrieved. Clip was placed.                         Clip manufacturer: [employer].                        - The examination was  otherwise normal on direct and                         retroflexion views.                        - Mucosal resection was performed. Resection and                         retrieval were complete. Recommendation:        - Discharge patient to home (with escort).                        - Resume previous diet.                        - Continue present medications.                        - Await pathology results.                        - Repeat colonoscopy in 3 years for surveillance. Procedure Code(s):     --- Professional ---                        [phone], Colonoscopy, flexible; with endoscopic mucosal                         resection                        [id number], 59, Colonoscopy, flexible; with removal of                         tumor(s), polyp(s), or other lesion(s) by snare                         technique Diagnosis Code(s):     --- Professional ---                        Z12.11, Encounter for screening for malignant neoplasm                         of colon                        D12.4, Benign neoplasm of descending colon                        D12.2, Benign neoplasm of ascending colon  D12.0, Benign neoplasm of cecum                        K64.0, First degree hemorrhoids CPT copyright 2022 [employer]. All rights reserved. The codes documented in this report are preliminary and upon coder review may  be revised to meet current compliance requirements. Ruel Kung, MD Ruel Kung MD, MD 09/20/2024 11:30:02 AM This report has been signed electronically. Number of Addenda: 0 Note Initiated On: 09/20/2024 11:06 AM Scope Withdrawal Time: 0 hours 14 minutes 21 seconds  Total Procedure Duration: 0 hours 17 minutes 1 second  Estimated Blood Loss:  Estimated blood loss: none.      [facility]

## 2024-09-20 NOTE — H&P (Signed)
 Ruel Kung , MD [address], [place], [place], [address] Phone: [phone] Fax: [phone]  Primary Care Physician:  Bertrum Charlie CROME, MD   Pre-Procedure History & Physical: HPI:  Ronnie Campos is a 63 y.o. male is here for an colonoscopy.   No past medical history on file.  Past Surgical History:  Procedure Laterality Date   HERNIA REPAIR      Prior to Admission medications   Medication Sig Start Date End Date Taking? Authorizing Provider  amLODipine  (NORVASC ) 10 MG tablet TAKE 1 TABLET BY MOUTH EVERY DAY 12/18/22  Yes Drubel, Manuelita, PA-C  cholecalciferol (VITAMIN D ) 1000 units tablet Take by mouth. Reported on 04/25/2016   Yes [provider]  lisinopril -hydrochlorothiazide  (ZESTORETIC ) 20-12.5 MG tablet TAKE 1 TABLET BY MOUTH EVERY DAY 09/30/22  Yes Drubel, Manuelita, PA-C  lisinopril -hydrochlorothiazide  (ZESTORETIC ) 20-25 MG tablet Take 1 tablet by mouth daily. 09/05/22  Yes Bertrum Charlie CROME, MD  MULTIPLE VITAMINS-MINERALS PO  05/10/10  Yes [provider]  OMEGA-3 FATTY ACIDS PO  05/10/10  Yes [provider]    Allergies as of 08/30/2024   (No Known Allergies)    Family History  Problem Relation Age of Onset   Irregular heart beat Mother    Scoliosis Sister    Stomach cancer Maternal Grandmother     Social History   Socioeconomic History   Marital status: Single    Spouse name: Not on file   Number of children: Not on file   Years of education: Not on file   Highest education level: Not on file  Occupational History   Not on file  Tobacco Use   Smoking status: Never   Smokeless tobacco: Never  Vaping Use   Vaping status: Never Used  Substance and Sexual Activity   Alcohol use: Yes    Alcohol/week: 0.0 standard drinks of alcohol   Drug use: No   Sexual activity: Yes    Birth  control/protection: None  Other Topics Concern   Not on file  Social History Narrative   Not on file   Social Drivers of Health   Financial Resource Strain: Low Risk  (06/08/2024)   Received from [facility]   Overall Financial Resource Strain (CARDIA)    Difficulty of Paying Living Expenses: Not hard at all  Food Insecurity: No Food Insecurity (06/08/2024)   Received from [facility]   Hunger Vital Sign    Within the past 12 months, you worried that your food would run out before you got the money to buy more.: Never true    Within the past 12 months, the food you bought just didn't last and you didn't have money to get more.: Never true  Transportation Needs: No Transportation Needs (06/08/2024)   Received from [facility]    In the past 12 months, has lack of transportation kept you from medical appointments or from getting medications?: No    Lack of Transportation (Non-Medical): No  Physical Activity: Not on file  Stress: Not on file  Social Connections: Unknown (04/28/2022)   Received from [facility]   Social Network    Social Network: Not on file  Intimate Partner Violence: Unknown (03/20/2022)   Received from [facility]   HITS    Physically Hurt: Not on file    Insult or Talk Down To: Not on file    Threaten Physical Harm: Not on file  Scream or Curse: Not on file    Review of Systems: See HPI, otherwise negative ROS  Physical Exam: BP (!) 184/95   Pulse 66   Temp (!) 97 F (36.1 C) (Temporal)   Resp 20   Ht 6' 1 (1.854 m)   Wt 100.3 kg   SpO2 99%   BMI 29.18 kg/m  General:   Alert,  pleasant and cooperative in NAD Head:  Normocephalic and atraumatic. Neck:  Supple; no masses or thyromegaly. Lungs:  Clear throughout to auscultation, normal respiratory effort.    Heart:  +S1, +S2, Regular rate and rhythm, No edema. Abdomen:  Soft, nontender and nondistended. Normal bowel  sounds, without guarding, and without rebound.   Neurologic:  Alert and  oriented x4;  grossly normal neurologically.  Impression/Plan: Ronnie Campos is here for an colonoscopy to be performed for Screening colonoscopy average risk   Risks, benefits, limitations, and alternatives regarding  colonoscopy have been reviewed with the patient.  Questions have been answered.  All parties agreeable.   Ruel Kung, MD  09/20/2024, 11:01 AM

## 2024-09-20 NOTE — Transfer of Care (Signed)
 Immediate Anesthesia Transfer of Care Note  Patient: Ronnie Campos  Procedure(s) Performed: COLONOSCOPY POLYPECTOMY, INTESTINE CONTROL OF HEMORRHAGE, GI TRACT, ENDOSCOPIC, BY CLIPPING OR OVERSEWING  Patient Location: Endoscopy Unit  Anesthesia Type:General  Level of Consciousness: drowsy  Airway & Oxygen Therapy: Patient Spontanous Breathing  Post-op Assessment: Report given to RN and Post -op Vital signs reviewed and stable  Post vital signs: Reviewed and stable  Last Vitals:  Vitals Value Taken Time  BP 145/93 09/20/24 11:30  Temp 35.8 C 09/20/24 11:30  Pulse 52 09/20/24 11:30  Resp 18 09/20/24 11:30  SpO2 96 % 09/20/24 11:30    Last Pain:  Vitals:   09/20/24 1130  TempSrc: Tympanic  PainSc: Asleep         Complications: No notable events documented.

## 2024-09-21 LAB — SURGICAL PATHOLOGY

## 2024-09-27 NOTE — Anesthesia Preprocedure Evaluation (Signed)
 Anesthesia Evaluation  Patient identified by MRN, date of birth, ID band Patient awake    Reviewed: Allergy & Precautions, H&P , NPO status , Patient's Chart, lab work & pertinent test results, reviewed documented beta blocker date and time   Airway Mallampati: II   Neck ROM: full    Dental  (+) Poor Dentition   Pulmonary neg pulmonary ROS   Pulmonary exam normal        Cardiovascular Exercise Tolerance: Poor negative cardio ROS Normal cardiovascular exam Rhythm:regular Rate:Normal     Neuro/Psych negative neurological ROS  negative psych ROS   GI/Hepatic Neg liver ROS,GERD  Medicated,,  Endo/Other  negative endocrine ROS    Renal/GU negative Renal ROS  negative genitourinary   Musculoskeletal   Abdominal   Peds  Hematology negative hematology ROS (+)   Anesthesia Other Findings No past medical history on file. Past Surgical History: 09/20/2024: COLONOSCOPY; N/A     Comment:  Procedure: COLONOSCOPY;  Surgeon: Therisa Bi, MD;                Location: [facility] ENDOSCOPY;  Service: Gastroenterology;                Laterality: N/A; 09/20/2024: HEMOSTASIS CLIP PLACEMENT     Comment:  Procedure: CONTROL OF HEMORRHAGE, GI TRACT, ENDOSCOPIC,               BY CLIPPING OR OVERSEWING;  Surgeon: Therisa Bi, MD;                Location: [facility] ENDOSCOPY;  Service: Gastroenterology;; No date: HERNIA REPAIR 09/20/2024: POLYPECTOMY     Comment:  Procedure: POLYPECTOMY, INTESTINE;  Surgeon: Therisa Bi, MD;  Location: [facility] ENDOSCOPY;  Service:               Gastroenterology;; 09/20/2024: SUBMUCOSAL INJECTION     Comment:  Procedure: INJECTION, SUBMUCOSAL;  Surgeon: Therisa Bi,              MD;  Location: [facility] ENDOSCOPY;  Service:               Gastroenterology;; BMI    Body Mass Index: 29.18 kg/m     Reproductive/Obstetrics negative OB ROS                              Anesthesia  Physical Anesthesia Plan  ASA: 2  Anesthesia Plan: General   Post-op Pain Management:    Induction:   PONV Risk Score and Plan:   Airway Management Planned:   Additional Equipment:   Intra-op Plan:   Post-operative Plan:   Informed Consent: I have reviewed the patients History and Physical, chart, labs and discussed the procedure including the risks, benefits and alternatives for the proposed anesthesia with the patient or authorized representative who has indicated his/her understanding and acceptance.     Dental Advisory Given  Plan Discussed with: CRNA  Anesthesia Plan Comments:         Anesthesia Quick Evaluation

## 2024-09-27 NOTE — Anesthesia Postprocedure Evaluation (Signed)
 Anesthesia Post Note  Patient: Ronnie Campos  Procedure(s) Performed: COLONOSCOPY POLYPECTOMY, INTESTINE CONTROL OF HEMORRHAGE, GI TRACT, ENDOSCOPIC, BY CLIPPING OR OVERSEWING INJECTION, SUBMUCOSAL  Patient location during evaluation: PACU Anesthesia Type: General Level of consciousness: awake and alert Pain management: pain level controlled Vital Signs Assessment: post-procedure vital signs reviewed and stable Respiratory status: spontaneous breathing, nonlabored ventilation, respiratory function stable and patient connected to nasal cannula oxygen Cardiovascular status: blood pressure returned to baseline and stable Postop Assessment: no apparent nausea or vomiting Anesthetic complications: no   No notable events documented.   Last Vitals:  Vitals:   09/20/24 1140 09/20/24 1150  BP: (!) 145/99 (!) 150/94  Pulse: (!) 54 (!) 52  Resp: 20 18  Temp:    SpO2: 95% 95%    Last Pain:  Vitals:   09/20/24 1150  TempSrc:   PainSc: 0-No pain                 Lynwood KANDICE Clause

## 2024-09-30 DIAGNOSIS — Z0000 Encounter for general adult medical examination without abnormal findings: Secondary | ICD-10-CM | POA: Diagnosis not present

## 2024-09-30 DIAGNOSIS — Z23 Encounter for immunization: Secondary | ICD-10-CM | POA: Diagnosis not present

## 2024-09-30 DIAGNOSIS — Z13220 Encounter for screening for lipoid disorders: Secondary | ICD-10-CM | POA: Diagnosis not present

## 2024-09-30 DIAGNOSIS — Z1159 Encounter for screening for other viral diseases: Secondary | ICD-10-CM | POA: Diagnosis not present

## 2024-09-30 DIAGNOSIS — Z114 Encounter for screening for human immunodeficiency virus [HIV]: Secondary | ICD-10-CM | POA: Diagnosis not present

## 2024-09-30 DIAGNOSIS — Z1389 Encounter for screening for other disorder: Secondary | ICD-10-CM | POA: Diagnosis not present

## 2024-09-30 DIAGNOSIS — K219 Gastro-esophageal reflux disease without esophagitis: Secondary | ICD-10-CM | POA: Diagnosis not present

## 2024-09-30 DIAGNOSIS — I10 Essential (primary) hypertension: Secondary | ICD-10-CM | POA: Diagnosis not present

## 2024-09-30 DIAGNOSIS — Z125 Encounter for screening for malignant neoplasm of prostate: Secondary | ICD-10-CM | POA: Diagnosis not present
